# Patient Record
Sex: Female | Born: 1960 | Race: Black or African American | Hispanic: No | Marital: Single | Smoking: Never smoker
Health system: Southern US, Community
[De-identification: ages and names within clinical notes are randomized; demographics above are authoritative.]

## PROBLEM LIST (undated history)

## (undated) DIAGNOSIS — I10 Essential (primary) hypertension: Secondary | ICD-10-CM

## (undated) DIAGNOSIS — C8300 Small cell B-cell lymphoma, unspecified site: Secondary | ICD-10-CM

## (undated) HISTORY — PX: PORTACATH PLACEMENT: SHX2246

## (undated) HISTORY — PX: OTHER SURGICAL HISTORY: SHX169

---

## 2018-04-01 ENCOUNTER — Observation Stay
Admission: EM | Admit: 2018-04-01 | Discharge: 2018-04-02 | Disposition: A | Discharge status: Home / Self Care | Payer: BLUE CROSS/BLUE SHIELD | Attending: Internal Medicine | Admitting: Internal Medicine

## 2018-04-01 ENCOUNTER — Other Ambulatory Visit: Payer: Self-pay

## 2018-04-01 ENCOUNTER — Emergency Department: Payer: BLUE CROSS/BLUE SHIELD

## 2018-04-01 DIAGNOSIS — I10 Essential (primary) hypertension: Secondary | ICD-10-CM | POA: Diagnosis present

## 2018-04-01 DIAGNOSIS — J9859 Other diseases of mediastinum, not elsewhere classified: Secondary | ICD-10-CM | POA: Diagnosis not present

## 2018-04-01 DIAGNOSIS — R609 Edema, unspecified: Secondary | ICD-10-CM

## 2018-04-01 DIAGNOSIS — Z8249 Family history of ischemic heart disease and other diseases of the circulatory system: Secondary | ICD-10-CM | POA: Diagnosis not present

## 2018-04-01 DIAGNOSIS — I82A12 Acute embolism and thrombosis of left axillary vein: Secondary | ICD-10-CM | POA: Diagnosis not present

## 2018-04-01 DIAGNOSIS — R222 Localized swelling, mass and lump, trunk: Secondary | ICD-10-CM | POA: Diagnosis present

## 2018-04-01 DIAGNOSIS — I82622 Acute embolism and thrombosis of deep veins of left upper extremity: Secondary | ICD-10-CM | POA: Diagnosis present

## 2018-04-01 HISTORY — DX: Essential (primary) hypertension: I10

## 2018-04-01 LAB — PROTIME-INR
INR: 0.99
Prothrombin Time: 13 seconds (ref 11.4–15.2)

## 2018-04-01 LAB — CBC
HCT: 34.2 % — ABNORMAL LOW (ref 36.0–46.0)
HEMOGLOBIN: 11.1 g/dL — AB (ref 12.0–15.0)
MCH: 28.7 pg (ref 26.0–34.0)
MCHC: 32.5 g/dL (ref 30.0–36.0)
MCV: 88.4 fL (ref 80.0–100.0)
Platelets: 421 10*3/uL — ABNORMAL HIGH (ref 150–400)
RBC: 3.87 MIL/uL (ref 3.87–5.11)
RDW: 13.1 % (ref 11.5–15.5)
WBC: 12.2 10*3/uL — ABNORMAL HIGH (ref 4.0–10.5)
nRBC: 0 % (ref 0.0–0.2)

## 2018-04-01 LAB — BASIC METABOLIC PANEL
Anion gap: 8 (ref 5–15)
BUN: 14 mg/dL (ref 6–20)
CHLORIDE: 102 mmol/L (ref 98–111)
CO2: 28 mmol/L (ref 22–32)
Calcium: 8.6 mg/dL — ABNORMAL LOW (ref 8.9–10.3)
Creatinine, Ser: 0.73 mg/dL (ref 0.44–1.00)
GFR calc Af Amer: >60 mL/min (ref >60)
GFR calc non Af Amer: >60 mL/min (ref >60)
Glucose, Bld: 109 mg/dL — ABNORMAL HIGH (ref 70–99)
Potassium: 2.9 mmol/L — ABNORMAL LOW (ref 3.5–5.1)
Sodium: 138 mmol/L (ref 135–145)

## 2018-04-01 LAB — FIBRIN DERIVATIVES D-DIMER (ARMC ONLY): Fibrin derivatives D-dimer (ARMC): 3685.09 ng/mL (FEU) — ABNORMAL HIGH (ref 0.00–499.00)

## 2018-04-01 LAB — APTT: aPTT: 30 seconds (ref 24–36)

## 2018-04-01 MED ORDER — ACETAMINOPHEN 325 MG PO TABS: 650.0000 mg | ORAL_TABLET | Freq: Four times a day (QID) | ORAL | Status: DC | PRN

## 2018-04-01 MED ORDER — HEPARIN BOLUS VIA INFUSION
4600.0000 [IU] | Freq: Once | INTRAVENOUS | Status: AC
  Administered 2018-04-01: 4600 [IU] via INTRAVENOUS
  Filled 2018-04-01: qty 4600

## 2018-04-01 MED ORDER — IOHEXOL 350 MG/ML SOLN
75.0000 mL | Freq: Once | INTRAVENOUS | Status: AC | PRN
  Administered 2018-04-01: 75 mL via INTRAVENOUS

## 2018-04-01 MED ORDER — ONDANSETRON HCL 4 MG PO TABS: 4.0000 mg | ORAL_TABLET | Freq: Four times a day (QID) | ORAL | Status: DC | PRN

## 2018-04-01 MED ORDER — ONDANSETRON HCL 4 MG/2ML IJ SOLN: 4.0000 mg | Freq: Four times a day (QID) | INTRAMUSCULAR | Status: DC | PRN

## 2018-04-01 MED ORDER — HEPARIN (PORCINE) 25000 UT/250ML-% IV SOLN
1250.0000 [IU]/h | INTRAVENOUS | Status: DC
  Administered 2018-04-01: 1250 [IU]/h via INTRAVENOUS
  Filled 2018-04-01: qty 250

## 2018-04-01 MED ORDER — ACETAMINOPHEN 650 MG RE SUPP: 650.0000 mg | Freq: Four times a day (QID) | RECTAL | Status: DC | PRN

## 2018-04-01 NOTE — ED Notes (Signed)
100% O2 RA monitor attached to finger on L hand. L radial pulse 1+; R radial 2+. L arm warm. Swollen in upper arm. Cap refill >3 sec on L fingers; R fingers <3 sec.

## 2018-04-01 NOTE — ED Notes (Signed)
Floor, Matilynn, called for pt coming att

## 2018-04-01 NOTE — ED Provider Notes (Signed)
[facility] Emergency Department Provider Note  Time seen: 7:10 PM  I have reviewed the triage vital signs and the nursing notes.   HISTORY  Chief Complaint Arm Swelling    HPI Berneice Zettlemoyer is a 58 y.o. female with a past medical history of hypertension, lung biopsy performed 3 days ago for mass in the chest, presents to the emergency department for left arm swelling beginning last night.  According to the patient since last night she has noted progressive increased swelling and discomfort to left upper extremity.  Denies any fever.  Has noted some mild shortness of breath today especially with exertion.  Denies any chest pain.  No prior history of DVT.  Patient was sent for an ultrasound by her primary care doctor, ultrasound was positive for DVT in the patient was sent to the emergency department.   Past Medical History:  Diagnosis Date  . Hypertension     There are no active problems to display for this patient.   Past Surgical History:  Procedure Laterality Date  . biospy      Prior to Admission medications   Not on File    Not on File  No family history on file.  Social History Social History   Tobacco Use  . Smoking status: Not on file  Substance Use Topics  . Alcohol use: Not on file  . Drug use: Not on file    Review of Systems Constitutional: Negative for fever. Cardiovascular: Negative for chest pain. Respiratory: Positive shortness of breath, worse with exertion. Gastrointestinal: Negative for abdominal pain Genitourinary: Negative for urinary compaints Musculoskeletal: Negative for musculoskeletal complaints Skin: Negative for skin complaints  Neurological: Negative for headache All other ROS negative  ____________________________________________   PHYSICAL EXAM:  VITAL SIGNS: ED Triage Vitals  Enc Vitals Group     BP 04/01/18 1721 110/70     Pulse Rate 04/01/18 1721 (!) 106     Resp 04/01/18 1721 18     Temp  04/01/18 1721 98.3 F (36.8 C)     Temp src --      SpO2 04/01/18 1721 99 %     Weight 04/01/18 1715 199 lb (90.3 kg)     Height 04/01/18 1715 5\' 5"  (1.651 m)     Head Circumference --      Peak Flow --      Pain Score 04/01/18 1715 3     Pain Loc --      Pain Edu? --      Excl. in [place]? --    Constitutional: Alert and oriented. Well appearing and in no distress. Eyes: Normal exam ENT   Head: Normocephalic and atraumatic   Mouth/Throat: Mucous membranes are moist. Cardiovascular: Normal rate, regular rhythm. No murmurs, rubs, or gallops. Respiratory: Normal respiratory effort without tachypnea nor retractions. Breath sounds are clear Gastrointestinal: Soft and nontender. No distention. Musculoskeletal: Patient has moderate left upper extremity edema and tenderness.  Intact radial pulse. Neurologic:  Normal speech and language. No gross focal neurologic deficits  Skin:  Skin is warm, dry and intact.  Psychiatric: Mood and affect are normal.    ____________________________________________     RADIOLOGY  Ultrasound consistent with significant DVT  ____________________________________________   INITIAL IMPRESSION / ASSESSMENT AND PLAN / ED COURSE  Pertinent labs & imaging results that were available during my care of the patient were reviewed by me and considered in my medical decision making (see chart for details).  Patient presents to the  emergency department for left upper extremity edema sent by PCP after a positive ultrasound.  Ultrasound positive for DVT which appears to be fairly extensive.  Patient states mild shortness of breath especially exertion mostly noted today.  Given these findings we will check labs, obtain CT angiography of the chest to help rule out pulmonary embolism and evaluate extent of chest involvement.  Given the extent of DVT we will discuss with vascular surgery once CT angiography is known.  Patient may possibly require admission to the  hospital for further work-up and possible intervention.  CT scan is negative for pulmonary embolism.  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  Small amount of air likely secondary to recent biopsy. ____________________________________________   FINAL CLINICAL IMPRESSION(S) / ED DIAGNOSES  Left upper extremity DVT   Harvest Dark, MD 04/01/18 2047

## 2018-04-01 NOTE — Progress Notes (Signed)
ANTICOAGULATION CONSULT NOTE - Initial Consult  Pharmacy Consult for Heparin Indication: DVT  Not on File  Patient Measurements: Height: 5\' 5"  (165.1 cm) Weight: 199 lb (90.3 kg) IBW/kg (Calculated) : 57 Heparin Dosing Weight: 77 kg  Vital Signs: Temp: 98.3 F (36.8 C) (01/24 1721) BP: 132/87 (01/24 2021) Pulse Rate: 102 (01/24 2021)  Labs: Recent Labs    04/01/18 1906  HGB 11.1*  HCT 34.2*  PLT 421*  CREATININE 0.73    Estimated Creatinine Clearance: 88.2 mL/min (by C-G formula based on SCr of 0.73 mg/dL).   Medical History: Past Medical History:  Diagnosis Date  . Hypertension    Assessment: Patient is a 58yo female presenting with left upper extremity swelling and pain. Ultrasound is positive for DVT. Pharmacy consulted for Heparin dosing.   Goal of Therapy:  Heparin level 0.3-0.7 units/ml Monitor platelets by anticoagulation protocol: Yes   Plan:  Give 4600 units bolus x 1 Start heparin infusion at 1250 units/hr Check anti-Xa level in 6 hours and daily while on heparin Continue to monitor H&H and platelets  Paulina Fusi, PharmD, BCPS 04/01/2018 9:00 PM

## 2018-04-01 NOTE — ED Notes (Signed)
Pt given warm blankets. Family at bedside.

## 2018-04-01 NOTE — ED Notes (Signed)
Pt leaving for CT.  

## 2018-04-01 NOTE — ED Triage Notes (Signed)
Pt comes via POV from home with c/o left arm swelling. Pt states she noticed it yesterday and thought it would go down but it didn't.    Pt called PCP and was told to come here. Pt states she had biopsy surgery completed on Tuesday.   Pt able to move arm but unable to raise higher than shoulder.

## 2018-04-01 NOTE — ED Notes (Addendum)
L wrist radial pulse 1+; warm, cap refill >3 sec. Pt denies pain/numbness to L arm. Pt denies SOB currently; sat 98% RA; RR 23; continues to cough some.

## 2018-04-01 NOTE — ED Notes (Signed)
Full rainbow sent 

## 2018-04-01 NOTE — H&P (Signed)
[place] [facility] [place] NAME: Brianna Chung    MR#:  [id number]  DATE OF BIRTH:  02-Mar-1961  DATE OF ADMISSION:  04/01/2018  PRIMARY CARE PHYSICIAN: [facility]   REQUESTING/REFERRING PHYSICIAN: Kerman Passey, MD  CHIEF COMPLAINT:   Chief Complaint  Patient presents with  . Arm Swelling    HISTORY OF PRESENT ILLNESS:  Brianna Chung  is a 58 y.o. female who presents with chief complaint as above.  Patient presents with 2 days of increasing left upper extremity swelling.  She was recently diagnosed with a mediastinal mass and is undergoing treatment for that including recent biopsy for which results are not back yet.  However, yesterday she noticed swelling to her left upper extremity, and noticed that it had gotten worse by this morning.  She called her doctor who recommended she come in for evaluation for possible DVT.  Here in the ED she was worked up and was found to have a significant left upper extremity DVT.  Vascular surgery was contacted by ED physician and they stated that in the setting of cancer directed thrombolytics would not be indicated.  Hospitalist were called for admission and treatment  PAST MEDICAL HISTORY:   Past Medical History:  Diagnosis Date  . Hypertension      PAST SURGICAL HISTORY:   Past Surgical History:  Procedure Laterality Date  . biospy       SOCIAL HISTORY:   Social History   Tobacco Use  . Smoking status: Never Smoker  Substance Use Topics  . Alcohol use: Yes    Alcohol/week: 1.0 standard drinks    Types: 1 Standard drinks or equivalent per week    Comment: margarita     FAMILY HISTORY:   Family History  Problem Relation Age of Onset  . Hypertension Mother   . Hypertension Father      DRUG ALLERGIES:  Not on File  MEDICATIONS AT HOME:   Prior to Admission medications   Not on File    REVIEW OF SYSTEMS:  Review of Systems  Constitutional: Negative  for chills, fever, malaise/fatigue and weight loss.  HENT: Negative for ear pain, hearing loss and tinnitus.   Eyes: Negative for blurred vision, double vision, pain and redness.  Respiratory: Negative for cough, hemoptysis and shortness of breath.   Cardiovascular: Negative for chest pain, palpitations, orthopnea and leg swelling.  Gastrointestinal: Negative for abdominal pain, constipation, diarrhea, nausea and vomiting.  Genitourinary: Negative for dysuria, frequency and hematuria.  Musculoskeletal: Negative for back pain, joint pain and neck pain.       Left upper extremity swelling  Skin:       No acne, rash, or lesions  Neurological: Negative for dizziness, tremors, focal weakness and weakness.  Endo/Heme/Allergies: Negative for polydipsia. Does not bruise/bleed easily.  Psychiatric/Behavioral: Negative for depression. The patient is not nervous/anxious and does not have insomnia.      VITAL SIGNS:   Vitals:   04/01/18 1915 04/01/18 1950 04/01/18 2021 04/01/18 2021  BP:    132/87  Pulse: 99 100 (!) 102   Resp: (!) 26 (!) 27 20   Temp:      SpO2: 99% 99% 97%   Weight:      Height:       Wt Readings from Last 3 Encounters:  04/01/18 90.3 kg    PHYSICAL EXAMINATION:  Physical Exam  Vitals reviewed. Constitutional: She is oriented to person, place, and time. She appears  well-developed and well-nourished. No distress.  HENT:  Head: Normocephalic and atraumatic.  Mouth/Throat: Oropharynx is clear and moist.  Eyes: Pupils are equal, round, and reactive to light. Conjunctivae and EOM are normal. No scleral icterus.  Neck: Normal range of motion. Neck supple. No JVD present. No thyromegaly present.  Cardiovascular: Normal rate, regular rhythm and intact distal pulses. Exam reveals no gallop and no friction rub.  No murmur heard. Respiratory: Effort normal and breath sounds normal. No respiratory distress. She has no wheezes. She has no rales.  GI: Soft. Bowel sounds are  normal. She exhibits no distension. There is no abdominal tenderness.  Musculoskeletal: Normal range of motion.        General: Edema (Left upper extremity) present.     Comments: No arthritis, no gout  Lymphadenopathy:    She has no cervical adenopathy.  Neurological: She is alert and oriented to person, place, and time. No cranial nerve deficit.  No dysarthria, no aphasia  Skin: Skin is warm and dry. No rash noted. No erythema.  Psychiatric: She has a normal mood and affect. Her behavior is normal. Judgment and thought content normal.    LABORATORY PANEL:   CBC Recent Labs  Lab 04/01/18 1906  WBC 12.2*  HGB 11.1*  HCT 34.2*  PLT 421*   ------------------------------------------------------------------------------------------------------------------  Chemistries  Recent Labs  Lab 04/01/18 1906  NA 138  K 2.9*  CL 102  CO2 28  GLUCOSE 109*  BUN 14  CREATININE 0.73  CALCIUM 8.6*   ------------------------------------------------------------------------------------------------------------------  Cardiac Enzymes No results for input(s): TROPONINI in the last 168 hours. ------------------------------------------------------------------------------------------------------------------  RADIOLOGY:  Ct Angio Chest Pe W And/or Wo Contrast  Result Date: 04/01/2018 CLINICAL DATA:  PE suspected, high pretest prob SOB, large LUE clot. Left arm swelling and shortness of breath. EXAM: CT ANGIOGRAPHY CHEST WITH CONTRAST TECHNIQUE: Multidetector CT imaging of the chest was performed using the standard protocol during bolus administration of intravenous contrast. Multiplanar CT image reconstructions and MIPs were obtained to evaluate the vascular anatomy. CONTRAST:  64mL OMNIPAQUE IOHEXOL 350 MG/ML SOLN COMPARISON:  No prior chest imaging. FINDINGS: Cardiovascular: No filling defects in the pulmonary arteries to suggest pulmonary embolus. The left upper lobe pulmonary arteries attenuated  due to large anterior mediastinal and hilar masslike opacity which partially encases segmental branches to the lingula and upper lobe. Anterior mediastinal mass abuts the aortic arch and SVC. No evidence of SVC occlusion, with dense contrast visualized throughout the entirety of the SVC. The left subclavian vein is not well evaluated on the current exam, but was occluded on ultrasound. No aortic dissection or aneurysm. The heart is normal in size. There is pericardial thickening/effusion inferiorly, which is in part contiguous with mediastinal mass. Mediastinum/Nodes: Large lobulated anterior mediastinal masslike opacity is contiguous with the left hilum. Dimensions are difficult to accurately define due to the shape size of the lesion, however measures at least 13 x 7.2 x 6.6 cm. Internal density is slightly heterogeneous. No internal calcifications. Mass lesion causes mass effect on left upper lobe bronchus, which is difficult to define but likely occluded, for example image 35 series 7. Mild left supraclavicular adenopathy. Prominent subcarinal/inferior right hilar lymph node measuring 13 mm short axis. Irregularly-shaped hypodense left thyroid nodule measures at least 2.7 cm cranial caudal dimension. There is mild mass effect on the trachea to the right. Small left axillary nodes. Lungs/Pleura: Masslike opacity in the anterior mediastinum is contiguous with the left hilum, unclear if this is central lung lesion  with associated conglomerate adenopathy or simply massive adenopathy. Borders in the left upper lobe are nodular and irregular with small left upper lobe nodules, for example image 51 series 7. Moderate size left pleural effusion which is partially loculated, causes partial atelectasis of the left lower lobe. No other than mild atelectasis in the right lower lobe, the right lung is clear. No confluent airspace disease or pulmonary nodule. No evidence of pneumothorax. Upper Abdomen: No nodule the  visualized adrenal glands. No definite focal hepatic lesion, use of contrast slightly limits assessment. Musculoskeletal: Air in the left anterior chest wall with associated thickening of the left pectoralis muscles, patient with recent chest mass biopsy. Subareolar breast tissue is fairly symmetric. No frank bony destructive change or evidence of focal lesion, with particular attention to left anterior ribs. Review of the MIP images confirms the above findings. IMPRESSION: 1. No pulmonary embolus. Left upper lobe pulmonary arteries are attenuated due to mediastinal mass. 2. 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 This has been reportedly biopsied an outside institution. Small amount of air and stranding in the left anterior chest wall is likely secondary to recent biopsy, recommend correlation with procedural history. 3. Moderate partially loculated left pleural effusion. Associated compressive atelectasis in the left lower lobe. 4. Left thyroid nodule. Electronically Signed   By: Keith Rake M.D.   On: 04/01/2018 20:39   US Venous Img Upper Uni Left  Result Date: 04/01/2018 CLINICAL DATA:  Left arm swelling for 1 day EXAM: LEFT UPPER EXTREMITY VENOUS DOPPLER ULTRASOUND TECHNIQUE: Gray-scale sonography with graded compression, as well as color Doppler and duplex ultrasound were performed to evaluate the upper extremity deep venous system from the level of the subclavian vein and including the jugular, axillary, basilic, radial, ulnar and upper cephalic vein. Spectral Doppler was utilized to evaluate flow at rest and with distal augmentation maneuvers. COMPARISON:  None. FINDINGS: Contralateral Subclavian Vein: Respiratory phasicity is normal and symmetric with the symptomatic side. No evidence of thrombus. Normal compressibility. Internal Jugular Vein: Occlusive thrombus is noted. Subclavian Vein: Extensive thrombus is noted.  Minimal flow is seen.  Axillary Vein: Occlusive thrombus is noted. Cephalic Vein: No evidence of thrombus. Normal compressibility, respiratory phasicity and response to augmentation. Basilic Vein: Occlusive thrombus is noted. Brachial Veins: 1 of the paired brachial veins demonstrates occlusive thrombus. Radial Veins: No evidence of thrombus. Normal compressibility, respiratory phasicity and response to augmentation. Ulnar Veins: No evidence of thrombus. Normal compressibility, respiratory phasicity and response to augmentation. Venous Reflux:  None visualized. Other Findings:  None visualized. IMPRESSION: Extensive left upper extremity thrombus involving the jugular, subclavian, axillary, basilic and brachial veins. Electronically Signed   By: Inez Catalina M.D.   On: 04/01/2018 18:35    EKG:  No orders found for this or any previous visit.  IMPRESSION AND PLAN:  Principal Problem:   Acute deep vein thrombosis (DVT) of left upper extremity (HCC) -IV heparin started.  Patient does not have any known provoking factors other than the possibility of this mass being cancer.  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 Labs, imaging and/or ECG reviewed by provider and discussed with patient/family. Management plans discussed with the patient and/or family.  DVT PROPHYLAXIS: Systemic anticoagulation  GI PROPHYLAXIS:  None  ADMISSION STATUS: Inpatient     CODE STATUS: Full  TOTAL TIME TAKING CARE OF THIS PATIENT: 45 minutes.   Ethlyn Daniels 04/01/2018, 9:34 PM  [facility]  Office  [phone]  CC: Primary care physician; [facility]  Note:  This document was prepared using Dragon voice recognition software and may include  unintentional dictation errors.

## 2018-04-01 NOTE — ED Notes (Signed)
MD Paduchowski and RN Metta Clines aware of pt being room and status.

## 2018-04-01 NOTE — ED Notes (Signed)
Pt denies CP but states recent SOB. Currently resting in bed with reg/unlabored breathing.

## 2018-04-02 LAB — BASIC METABOLIC PANEL
Anion gap: 7 (ref 5–15)
BUN: 11 mg/dL (ref 6–20)
CO2: 26 mmol/L (ref 22–32)
CREATININE: 0.78 mg/dL (ref 0.44–1.00)
Calcium: 8.7 mg/dL — ABNORMAL LOW (ref 8.9–10.3)
Chloride: 105 mmol/L (ref 98–111)
GFR calc Af Amer: >60 mL/min (ref >60)
GFR calc non Af Amer: >60 mL/min (ref >60)
Glucose, Bld: 114 mg/dL — ABNORMAL HIGH (ref 70–99)
Potassium: 3.1 mmol/L — ABNORMAL LOW (ref 3.5–5.1)
SODIUM: 138 mmol/L (ref 135–145)

## 2018-04-02 LAB — CBC
HCT: 32.5 % — ABNORMAL LOW (ref 36.0–46.0)
Hemoglobin: 10.6 g/dL — ABNORMAL LOW (ref 12.0–15.0)
MCH: 28.2 pg (ref 26.0–34.0)
MCHC: 32.6 g/dL (ref 30.0–36.0)
MCV: 86.4 fL (ref 80.0–100.0)
Platelets: 417 10*3/uL — ABNORMAL HIGH (ref 150–400)
RBC: 3.76 MIL/uL — ABNORMAL LOW (ref 3.87–5.11)
RDW: 13.2 % (ref 11.5–15.5)
WBC: 11.7 10*3/uL — ABNORMAL HIGH (ref 4.0–10.5)
nRBC: 0 % (ref 0.0–0.2)

## 2018-04-02 LAB — HEPARIN LEVEL (UNFRACTIONATED)
Heparin Unfractionated: 0.67 IU/mL (ref 0.30–0.70)
Heparin Unfractionated: 0.6 IU/mL (ref 0.30–0.70)

## 2018-04-02 MED ORDER — APIXABAN 5 MG PO TABS: 5.0000 mg | ORAL_TABLET | Freq: Two times a day (BID) | ORAL | 0 refills | Status: AC

## 2018-04-02 MED ORDER — APIXABAN 5 MG PO TABS
10.0000 mg | ORAL_TABLET | Freq: Two times a day (BID) | ORAL | Status: DC
  Administered 2018-04-02: 10 mg via ORAL
  Filled 2018-04-02: qty 2

## 2018-04-02 MED ORDER — APIXABAN 5 MG PO TABS: 5.0000 mg | ORAL_TABLET | Freq: Two times a day (BID) | ORAL | Status: DC

## 2018-04-02 MED ORDER — APIXABAN 5 MG PO TABS: 10.0000 mg | ORAL_TABLET | Freq: Two times a day (BID) | ORAL | 0 refills | Status: DC

## 2018-04-02 NOTE — Discharge Summary (Signed)
[place] at [place] NAME: Brianna Chung    MR#:  [id number]  [place]:  1960-07-24  DATE OF ADMISSION:  04/01/2018 ADMITTING PHYSICIAN: Lance Coon, MD  DATE OF DISCHARGE: April 02, 2018  PRIMARY CARE PHYSICIAN: Center, [facility]    ADMISSION DIAGNOSIS:  Swelling [R60.9] Acute deep vein thrombosis (DVT) of axillary vein of left upper extremity (HCC) [email or web address].[email or web address]]  DISCHARGE DIAGNOSIS:  Principal Problem:   Acute deep vein thrombosis (DVT) of left upper extremity (HCC) Active Problems:   Mediastinal mass   HTN (hypertension)   SECONDARY DIAGNOSIS:   Past Medical History:  Diagnosis Date  . Hypertension     HOSPITAL COURSE:   58 year old female with history of recent diagnosis of mediastinal mass who presented to the emergency room due to left upper extremity arm swelling.  1.  Acute left upper extremity DVT: Patient was transitioned from heparin to Eliquis. Side effects, alternatives, risks and benefits were discussed with patient who accepts the risk not included to only bleeding. She will have follow-up with her PCP.  2.  Mediastinal mass: This is being worked up at [facility]. 3.  Essential hypertension: Continue HCTZ and losartan  DISCHARGE CONDITIONS AND DIET:   Stable for discharge on heart healthy diet  CONSULTS OBTAINED:    DRUG ALLERGIES:  No Known Allergies  DISCHARGE MEDICATIONS:   Allergies as of 04/02/2018   No Known Allergies     Medication List    STOP taking these medications   oxyCODONE 5 MG immediate release tablet Commonly known as:  Oxy IR/ROXICODONE     TAKE these medications   apixaban 5 MG Tabs tablet Commonly known as:  ELIQUIS Take 2 tablets (10 mg total) by mouth 2 (two) times daily for 6 days.   apixaban 5 MG Tabs tablet Commonly known as:  ELIQUIS Take 1 tablet (5 mg total) by mouth 2 (two) times daily. Start taking on:  April 09, 2018   hydrochlorothiazide  12.5 MG tablet Commonly known as:  HYDRODIURIL Take 12.5 mg by mouth daily.   losartan 100 MG tablet Commonly known as:  COZAAR Take 100 mg by mouth daily.   potassium chloride SA 20 MEQ tablet Commonly known as:  K-DUR,KLOR-CON Take 20 mEq by mouth 2 (two) times daily.   Vitamin D (Ergocalciferol) 1.25 MG (50000 UT) Caps capsule Commonly known as:  DRISDOL Take 1 capsule by mouth every Sunday.         Today   CHIEF COMPLAINT:  Patient is doing well and ready to be discharged today   VITAL SIGNS:  Blood pressure 133/90, pulse 90, temperature 98.7 F (37.1 C), temperature source Oral, resp. rate 20, height 5\' 5"  (1.651 m), weight 90.7 kg, last menstrual period 03/30/2018, SpO2 96 %.   REVIEW OF SYSTEMS:  Review of Systems  Constitutional: Negative.  Negative for chills, fever and malaise/fatigue.  HENT: Negative.  Negative for ear discharge, ear pain, hearing loss, nosebleeds and sore throat.   Eyes: Negative.  Negative for blurred vision and pain.  Respiratory: Negative.  Negative for cough, hemoptysis, shortness of breath and wheezing.   Cardiovascular: Negative.  Negative for chest pain, palpitations and leg swelling.  Gastrointestinal: Negative.  Negative for abdominal pain, blood in stool, diarrhea, nausea and vomiting.  Genitourinary: Negative.  Negative for dysuria.  Musculoskeletal: Negative for back pain.       Upper extremity swelling  Skin: Negative.   Neurological: Negative for dizziness, tremors,  speech change, focal weakness, seizures and headaches.  Endo/Heme/Allergies: Negative.  Does not bruise/bleed easily.  Psychiatric/Behavioral: Negative.  Negative for depression, hallucinations and suicidal ideas.     PHYSICAL EXAMINATION:  GENERAL:  58 y.o.-year-old patient lying in the bed with no acute distress.  NECK:  Supple, no jugular venous distention. No thyroid enlargement, no tenderness.  LUNGS: Normal breath sounds bilaterally, no wheezing,  rales,rhonchi  No use of accessory muscles of respiration.  CARDIOVASCULAR: S1, S2 normal. No murmurs, rubs, or gallops.  ABDOMEN: Soft, non-tender, non-distended. Bowel sounds present. No organomegaly or mass.  EXTREMITIES: No pedal edema, cyanosis, or clubbing.  Left upper extremity swelling PSYCHIATRIC: The patient is alert and oriented x 3.  SKIN: No obvious rash, lesion, or ulcer.   DATA REVIEW:   CBC Recent Labs  Lab 04/02/18 0350  WBC 11.7*  HGB 10.6*  HCT 32.5*  PLT 417*    Chemistries  Recent Labs  Lab 04/02/18 0350  NA 138  K 3.1*  CL 105  CO2 26  GLUCOSE 114*  BUN 11  CREATININE 0.78  CALCIUM 8.7*    Cardiac Enzymes No results for input(s): TROPONINI in the last 168 hours.  Microbiology Results  @MICRORSLT48 @  RADIOLOGY:  Ct Angio Chest Pe W And/or Wo Contrast  Result Date: 04/01/2018 CLINICAL DATA:  PE suspected, high pretest prob SOB, large LUE clot. Left arm swelling and shortness of breath. EXAM: CT ANGIOGRAPHY CHEST WITH CONTRAST TECHNIQUE: Multidetector CT imaging of the chest was performed using the standard protocol during bolus administration of intravenous contrast. Multiplanar CT image reconstructions and MIPs were obtained to evaluate the vascular anatomy. CONTRAST:  67mL OMNIPAQUE IOHEXOL 350 MG/ML SOLN COMPARISON:  No prior chest imaging. FINDINGS: Cardiovascular: No filling defects in the pulmonary arteries to suggest pulmonary embolus. The left upper lobe pulmonary arteries attenuated due to large anterior mediastinal and hilar masslike opacity which partially encases segmental branches to the lingula and upper lobe. Anterior mediastinal mass abuts the aortic arch and SVC. No evidence of SVC occlusion, with dense contrast visualized throughout the entirety of the SVC. The left subclavian vein is not well evaluated on the current exam, but was occluded on ultrasound. No aortic dissection or aneurysm. The heart is normal in size. There is  pericardial thickening/effusion inferiorly, which is in part contiguous with mediastinal mass. Mediastinum/Nodes: Large lobulated anterior mediastinal masslike opacity is contiguous with the left hilum. Dimensions are difficult to accurately define due to the shape size of the lesion, however measures at least 13 x 7.2 x 6.6 cm. Internal density is slightly heterogeneous. No internal calcifications. Mass lesion causes mass effect on left upper lobe bronchus, which is difficult to define but likely occluded, for example image 35 series 7. Mild left supraclavicular adenopathy. Prominent subcarinal/inferior right hilar lymph node measuring 13 mm short axis. Irregularly-shaped hypodense left thyroid nodule measures at least 2.7 cm cranial caudal dimension. There is mild mass effect on the trachea to the right. Small left axillary nodes. Lungs/Pleura: Masslike opacity in the anterior mediastinum is contiguous with the left hilum, unclear if this is central lung lesion with associated conglomerate adenopathy or simply massive adenopathy. Borders in the left upper lobe are nodular and irregular with small left upper lobe nodules, for example image 51 series 7. Moderate size left pleural effusion which is partially loculated, causes partial atelectasis of the left lower lobe. No other than mild atelectasis in the right lower lobe, the right lung is clear. No confluent airspace disease or  pulmonary nodule. No evidence of pneumothorax. Upper Abdomen: No nodule the visualized adrenal glands. No definite focal hepatic lesion, use of contrast slightly limits assessment. Musculoskeletal: Air in the left anterior chest wall with associated thickening of the left pectoralis muscles, patient with recent chest mass biopsy. Subareolar breast tissue is fairly symmetric. No frank bony destructive change or evidence of focal lesion, with particular attention to left anterior ribs. Review of the MIP images confirms the above findings.  IMPRESSION: 1. No pulmonary embolus. Left upper lobe pulmonary arteries are attenuated due to mediastinal mass. 2. 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 This has been reportedly biopsied an outside institution. Small amount of air and stranding in the left anterior chest wall is likely secondary to recent biopsy, recommend correlation with procedural history. 3. Moderate partially loculated left pleural effusion. Associated compressive atelectasis in the left lower lobe. 4. Left thyroid nodule. Electronically Signed   By: Keith Rake M.D.   On: 04/01/2018 20:39   US Venous Img Upper Uni Left  Result Date: 04/01/2018 CLINICAL DATA:  Left arm swelling for 1 day EXAM: LEFT UPPER EXTREMITY VENOUS DOPPLER ULTRASOUND TECHNIQUE: Gray-scale sonography with graded compression, as well as color Doppler and duplex ultrasound were performed to evaluate the upper extremity deep venous system from the level of the subclavian vein and including the jugular, axillary, basilic, radial, ulnar and upper cephalic vein. Spectral Doppler was utilized to evaluate flow at rest and with distal augmentation maneuvers. COMPARISON:  None. FINDINGS: Contralateral Subclavian Vein: Respiratory phasicity is normal and symmetric with the symptomatic side. No evidence of thrombus. Normal compressibility. Internal Jugular Vein: Occlusive thrombus is noted. Subclavian Vein: Extensive thrombus is noted.  Minimal flow is seen. Axillary Vein: Occlusive thrombus is noted. Cephalic Vein: No evidence of thrombus. Normal compressibility, respiratory phasicity and response to augmentation. Basilic Vein: Occlusive thrombus is noted. Brachial Veins: 1 of the paired brachial veins demonstrates occlusive thrombus. Radial Veins: No evidence of thrombus. Normal compressibility, respiratory phasicity and response to augmentation. Ulnar Veins: No evidence of thrombus. Normal compressibility,  respiratory phasicity and response to augmentation. Venous Reflux:  None visualized. Other Findings:  None visualized. IMPRESSION: Extensive left upper extremity thrombus involving the jugular, subclavian, axillary, basilic and brachial veins. Electronically Signed   By: Inez Catalina M.D.   On: 04/01/2018 18:35      Allergies as of 04/02/2018   No Known Allergies     Medication List    STOP taking these medications   oxyCODONE 5 MG immediate release tablet Commonly known as:  Oxy IR/ROXICODONE     TAKE these medications   apixaban 5 MG Tabs tablet Commonly known as:  ELIQUIS Take 2 tablets (10 mg total) by mouth 2 (two) times daily for 6 days.   apixaban 5 MG Tabs tablet Commonly known as:  ELIQUIS Take 1 tablet (5 mg total) by mouth 2 (two) times daily. Start taking on:  April 09, 2018   hydrochlorothiazide 12.5 MG tablet Commonly known as:  HYDRODIURIL Take 12.5 mg by mouth daily.   losartan 100 MG tablet Commonly known as:  COZAAR Take 100 mg by mouth daily.   potassium chloride SA 20 MEQ tablet Commonly known as:  K-DUR,KLOR-CON Take 20 mEq by mouth 2 (two) times daily.   Vitamin D (Ergocalciferol) 1.25 MG (50000 UT) Caps capsule Commonly known as:  DRISDOL Take 1 capsule by mouth every Sunday.  Management plans discussed with the patient and she is in agreement. Stable for discharge home  Patient should follow up with pcp  CODE STATUS:     Code Status Orders  (From admission, onward)         Start     Ordered   04/01/18 2237  Full code  Continuous     04/01/18 2236        Code Status History    This patient has a current code status but no historical code status.      TOTAL TIME TAKING CARE OF THIS PATIENT: 38 minutes.    Note: This dictation was prepared with Dragon dictation along with smaller phrase technology. Any transcriptional errors that result from this process are unintentional.  Vern Guerette M.D on 04/02/2018 at  10:50 AM  Between 7am to 6pm - Pager - [phone] After 6pm go to [email or web address] - password [email or web address] [facility]  Office  [phone]  CC: Primary care physician; Center, [facility]

## 2018-04-02 NOTE — Progress Notes (Signed)
ANTICOAGULATION CONSULT NOTE - Initial Consult  Pharmacy Consult for apixaban (Eliquis) Dosing  Indication: DVT  No Known Allergies  Patient Measurements: Height: 5\' 5"  (165.1 cm) Weight: 200 lb (90.7 kg) IBW/kg (Calculated) : 57 Heparin Dosing Weight: 77 kg  Vital Signs: Temp: 98.7 F (37.1 C) (01/25 0549) Temp Source: Oral (01/25 0549) BP: 133/90 (01/25 0549) Pulse Rate: 90 (01/25 0549)  Labs: Recent Labs    04/01/18 1720 04/01/18 1906 04/02/18 0350  HGB  --  11.1* 10.6*  HCT  --  34.2* 32.5*  PLT  --  421* 417*  APTT 30  --   --   LABPROT 13.0  --   --   INR 0.99  --   --   HEPARINUNFRC  --   --  0.67  CREATININE  --  0.73 0.78    Estimated Creatinine Clearance: 88.4 mL/min (by C-G formula based on SCr of 0.78 mg/dL).   Medical History: Past Medical History:  Diagnosis Date  . Hypertension    Assessment: Patient is a 57yo female presenting with left upper extremity swelling and pain. Ultrasound is positive for DVT. Pharmacy consulted for Heparin dosing on admission. Pharmacy now consulted for apixaban dosing.    Plan:  Start Apixaban 10mg  BID x 7 days, followed by apixaban 5mg  BID thereafter.   Heparin infusion to stop at same time 1st dose of apixaban is given.   Pernell Dupre, PharmD, BCPS Clinical Pharmacist 04/02/2018 10:29 AM

## 2018-04-02 NOTE — Discharge Instructions (Signed)

## 2018-04-02 NOTE — Progress Notes (Signed)
ANTICOAGULATION CONSULT NOTE - Initial Consult  Pharmacy Consult for Heparin Indication: DVT  No Known Allergies  Patient Measurements: Height: 5\' 5"  (165.1 cm) Weight: 200 lb (90.7 kg) IBW/kg (Calculated) : 57 Heparin Dosing Weight: 77 kg  Vital Signs: Temp: 99.1 F (37.3 C) (01/24 2251) Temp Source: Oral (01/24 2251) BP: 142/85 (01/24 2251) Pulse Rate: 107 (01/24 2251)  Labs: Recent Labs    04/01/18 1720 04/01/18 1906 04/02/18 0350  HGB  --  11.1* 10.6*  HCT  --  34.2* 32.5*  PLT  --  421* 417*  APTT 30  --   --   LABPROT 13.0  --   --   INR 0.99  --   --   HEPARINUNFRC  --   --  0.67  CREATININE  --  0.73 0.78    Estimated Creatinine Clearance: 88.4 mL/min (by C-G formula based on SCr of 0.78 mg/dL).   Medical History: Past Medical History:  Diagnosis Date  . Hypertension    Assessment: Patient is a 58yo female presenting with left upper extremity swelling and pain. Ultrasound is positive for DVT. Pharmacy consulted for Heparin dosing.   Goal of Therapy:  Heparin level 0.3-0.7 units/ml Monitor platelets by anticoagulation protocol: Yes   Plan:  Give 4600 units bolus x 1 Start heparin infusion at 1250 units/hr Check anti-Xa level in 6 hours and daily while on heparin Continue to monitor H&H and platelets   1/25 AM heparin level 0.67. Recheck in 6 hours to confirm.  Sim Boast, PharmD, BCPS  04/02/18 4:47 AM

## 2018-04-02 NOTE — Progress Notes (Signed)
MD ordered discharge home. Discharge instructions, prescriptions and instructions for follow up appointment was reviewed with Brianna Chung. Printed information on DVT and Eliquis were reviewed and provided. Patient verbalized understanding.

## 2018-04-04 LAB — HIV ANTIBODY (ROUTINE TESTING W REFLEX): HIV SCREEN 4TH GENERATION: NONREACTIVE

## 2018-04-08 ENCOUNTER — Telehealth: Payer: Self-pay | Admitting: Licensed Clinical Social Worker

## 2018-04-08 NOTE — Telephone Encounter (Signed)
CSW attempted to follow up with patient regarding EMMI response. Patient's contact number went straight to voicemail. CSW will attempt a second time. Shela Leff MSW,LCSW [phone]

## 2018-04-10 ENCOUNTER — Telehealth: Payer: Self-pay | Admitting: Licensed Clinical Social Worker

## 2018-04-10 NOTE — Telephone Encounter (Signed)
The CSW attempted a second contact with the patient to discuss her answers on EMMI call indicating anhedonia and risk for depression. The CSW was unable to reach the patient and left a HIPPA compliant voicemail with contact information for today and regular business hours.  Santiago Bumpers, MSW, Latanya Presser [phone]

## 2018-04-16 ENCOUNTER — Inpatient Hospital Stay
Admission: EM | Admit: 2018-04-16 | Discharge: 2018-04-20 | DRG: 871 | Disposition: A | Discharge status: Home / Self Care | Payer: BLUE CROSS/BLUE SHIELD | Attending: Internal Medicine | Admitting: Internal Medicine

## 2018-04-16 ENCOUNTER — Other Ambulatory Visit: Payer: Self-pay

## 2018-04-16 ENCOUNTER — Emergency Department: Payer: BLUE CROSS/BLUE SHIELD

## 2018-04-16 ENCOUNTER — Encounter: Payer: Self-pay | Admitting: Emergency Medicine

## 2018-04-16 DIAGNOSIS — Z79899 Other long term (current) drug therapy: Secondary | ICD-10-CM

## 2018-04-16 DIAGNOSIS — C8330 Diffuse large B-cell lymphoma, unspecified site: Secondary | ICD-10-CM | POA: Diagnosis present

## 2018-04-16 DIAGNOSIS — Z7901 Long term (current) use of anticoagulants: Secondary | ICD-10-CM

## 2018-04-16 DIAGNOSIS — D701 Agranulocytosis secondary to cancer chemotherapy: Secondary | ICD-10-CM | POA: Diagnosis present

## 2018-04-16 DIAGNOSIS — I10 Essential (primary) hypertension: Secondary | ICD-10-CM | POA: Diagnosis present

## 2018-04-16 DIAGNOSIS — J189 Pneumonia, unspecified organism: Secondary | ICD-10-CM | POA: Diagnosis present

## 2018-04-16 DIAGNOSIS — T451X5A Adverse effect of antineoplastic and immunosuppressive drugs, initial encounter: Secondary | ICD-10-CM | POA: Diagnosis present

## 2018-04-16 DIAGNOSIS — A419 Sepsis, unspecified organism: Secondary | ICD-10-CM | POA: Diagnosis present

## 2018-04-16 DIAGNOSIS — Z8249 Family history of ischemic heart disease and other diseases of the circulatory system: Secondary | ICD-10-CM | POA: Diagnosis not present

## 2018-04-16 DIAGNOSIS — I82C12 Acute embolism and thrombosis of left internal jugular vein: Secondary | ICD-10-CM | POA: Diagnosis present

## 2018-04-16 DIAGNOSIS — Z95828 Presence of other vascular implants and grafts: Secondary | ICD-10-CM | POA: Diagnosis not present

## 2018-04-16 DIAGNOSIS — I82622 Acute embolism and thrombosis of deep veins of left upper extremity: Secondary | ICD-10-CM | POA: Diagnosis present

## 2018-04-16 DIAGNOSIS — Z86718 Personal history of other venous thrombosis and embolism: Secondary | ICD-10-CM | POA: Diagnosis not present

## 2018-04-16 DIAGNOSIS — C8520 Mediastinal (thymic) large B-cell lymphoma, unspecified site: Secondary | ICD-10-CM | POA: Diagnosis not present

## 2018-04-16 DIAGNOSIS — J90 Pleural effusion, not elsewhere classified: Secondary | ICD-10-CM | POA: Diagnosis present

## 2018-04-16 DIAGNOSIS — Z801 Family history of malignant neoplasm of trachea, bronchus and lung: Secondary | ICD-10-CM | POA: Diagnosis not present

## 2018-04-16 DIAGNOSIS — E876 Hypokalemia: Secondary | ICD-10-CM | POA: Diagnosis present

## 2018-04-16 DIAGNOSIS — D6959 Other secondary thrombocytopenia: Secondary | ICD-10-CM | POA: Diagnosis present

## 2018-04-16 DIAGNOSIS — R0602 Shortness of breath: Secondary | ICD-10-CM

## 2018-04-16 DIAGNOSIS — R509 Fever, unspecified: Secondary | ICD-10-CM

## 2018-04-16 DIAGNOSIS — R5383 Other fatigue: Secondary | ICD-10-CM | POA: Diagnosis not present

## 2018-04-16 HISTORY — DX: Small cell B-cell lymphoma, unspecified site: C83.00

## 2018-04-16 LAB — CBC WITH DIFFERENTIAL/PLATELET
ABS IMMATURE GRANULOCYTES: 0.06 10*3/uL (ref 0.00–0.07)
Basophils Absolute: 0 10*3/uL (ref 0.0–0.1)
Basophils Relative: 0 %
Eosinophils Absolute: 0 10*3/uL (ref 0.0–0.5)
Eosinophils Relative: 0 %
HCT: 30.2 % — ABNORMAL LOW (ref 36.0–46.0)
HEMOGLOBIN: 10.4 g/dL — AB (ref 12.0–15.0)
Immature Granulocytes: 1 %
LYMPHS ABS: 0.2 10*3/uL — AB (ref 0.7–4.0)
LYMPHS PCT: 3 %
MCH: 28.5 pg (ref 26.0–34.0)
MCHC: 34.4 g/dL (ref 30.0–36.0)
MCV: 82.7 fL (ref 80.0–100.0)
Monocytes Absolute: 0 10*3/uL — ABNORMAL LOW (ref 0.1–1.0)
Monocytes Relative: 0 %
Neutro Abs: 7.2 10*3/uL (ref 1.7–7.7)
Neutrophils Relative %: 96 %
Platelets: 314 10*3/uL (ref 150–400)
RBC: 3.65 MIL/uL — ABNORMAL LOW (ref 3.87–5.11)
RDW: 12.2 % (ref 11.5–15.5)
WBC: 7.5 10*3/uL (ref 4.0–10.5)
nRBC: 0 % (ref 0.0–0.2)

## 2018-04-16 LAB — BASIC METABOLIC PANEL
Anion gap: 7 (ref 5–15)
BUN: 22 mg/dL — ABNORMAL HIGH (ref 6–20)
CALCIUM: 8.6 mg/dL — AB (ref 8.9–10.3)
CO2: 28 mmol/L (ref 22–32)
Chloride: 98 mmol/L (ref 98–111)
Creatinine, Ser: 0.71 mg/dL (ref 0.44–1.00)
GFR calc Af Amer: >60 mL/min (ref >60)
GFR calc non Af Amer: >60 mL/min (ref >60)
Glucose, Bld: 100 mg/dL — ABNORMAL HIGH (ref 70–99)
POTASSIUM: 2.9 mmol/L — AB (ref 3.5–5.1)
Sodium: 133 mmol/L — ABNORMAL LOW (ref 135–145)

## 2018-04-16 LAB — URINALYSIS, COMPLETE (UACMP) WITH MICROSCOPIC
BACTERIA UA: NONE SEEN
Bilirubin Urine: NEGATIVE
Glucose, UA: NEGATIVE mg/dL
Hgb urine dipstick: NEGATIVE
Ketones, ur: NEGATIVE mg/dL
Leukocytes, UA: NEGATIVE
Nitrite: NEGATIVE
Protein, ur: NEGATIVE mg/dL
SPECIFIC GRAVITY, URINE: 1.014 (ref 1.005–1.030)
pH: 5 (ref 5.0–8.0)

## 2018-04-16 LAB — INFLUENZA PANEL BY PCR (TYPE A & B)
Influenza A By PCR: NEGATIVE
Influenza B By PCR: NEGATIVE

## 2018-04-16 LAB — TSH: TSH: 0.348 u[IU]/mL — ABNORMAL LOW (ref 0.350–4.500)

## 2018-04-16 MED ORDER — POTASSIUM CHLORIDE CRYS ER 20 MEQ PO TBCR
40.0000 meq | EXTENDED_RELEASE_TABLET | ORAL | Status: AC
  Administered 2018-04-16 – 2018-04-17 (×2): 40 meq via ORAL
  Filled 2018-04-16 (×2): qty 2

## 2018-04-16 MED ORDER — POLYETHYLENE GLYCOL 3350 17 G PO PACK
17.00 | PACK | ORAL | Status: DC
Start: 2018-04-16 — End: 2018-04-16

## 2018-04-16 MED ORDER — FIRST-MOUTHWASH BLM MT SUSP
5.00 | OROMUCOSAL | Status: DC
Start: ? — End: 2018-04-16

## 2018-04-16 MED ORDER — SENNOSIDES-DOCUSATE SODIUM 8.6-50 MG PO TABS
2.00 | ORAL_TABLET | ORAL | Status: DC
Start: 2018-04-16 — End: 2018-04-16

## 2018-04-16 MED ORDER — ACYCLOVIR 200 MG PO CAPS
400.00 | ORAL_CAPSULE | ORAL | Status: DC
Start: 2018-04-15 — End: 2018-04-16

## 2018-04-16 MED ORDER — POTASSIUM CHLORIDE ER 20 MEQ PO TBCR
40.00 | EXTENDED_RELEASE_TABLET | ORAL | Status: DC
Start: 2018-04-16 — End: 2018-04-16

## 2018-04-16 MED ORDER — LORAZEPAM 1 MG PO TABS
1.00 | ORAL_TABLET | ORAL | Status: DC
Start: ? — End: 2018-04-16

## 2018-04-16 MED ORDER — HYDROCHLOROTHIAZIDE 25 MG PO TABS
12.50 | ORAL_TABLET | ORAL | Status: DC
Start: 2018-04-16 — End: 2018-04-16

## 2018-04-16 MED ORDER — HYDROCHLOROTHIAZIDE 25 MG PO TABS
12.5000 mg | ORAL_TABLET | Freq: Every day | ORAL | Status: DC
  Administered 2018-04-17 – 2018-04-20 (×4): 12.5 mg via ORAL
  Filled 2018-04-16 (×4): qty 1

## 2018-04-16 MED ORDER — VANCOMYCIN HCL IN DEXTROSE 1-5 GM/200ML-% IV SOLN
1000.0000 mg | Freq: Once | INTRAVENOUS | Status: DC
  Filled 2018-04-16: qty 200

## 2018-04-16 MED ORDER — ACYCLOVIR 200 MG PO CAPS
200.0000 mg | ORAL_CAPSULE | Freq: Two times a day (BID) | ORAL | Status: DC
  Administered 2018-04-16 – 2018-04-20 (×8): 200 mg via ORAL
  Filled 2018-04-16 (×9): qty 1

## 2018-04-16 MED ORDER — LOSARTAN POTASSIUM 50 MG PO TABS
100.0000 mg | ORAL_TABLET | Freq: Every day | ORAL | Status: DC
  Administered 2018-04-17 – 2018-04-20 (×4): 100 mg via ORAL
  Filled 2018-04-16 (×4): qty 2

## 2018-04-16 MED ORDER — VITAMIN D (ERGOCALCIFEROL) 1.25 MG (50000 UNIT) PO CAPS
50000.0000 [IU] | ORAL_CAPSULE | ORAL | Status: DC
  Administered 2018-04-17: 15:00:00 50000 [IU] via ORAL
  Filled 2018-04-16: qty 1

## 2018-04-16 MED ORDER — POTASSIUM CHLORIDE CRYS ER 20 MEQ PO TBCR
20.0000 meq | EXTENDED_RELEASE_TABLET | Freq: Two times a day (BID) | ORAL | Status: DC
  Administered 2018-04-16 – 2018-04-17 (×3): 20 meq via ORAL
  Filled 2018-04-16 (×3): qty 1

## 2018-04-16 MED ORDER — SODIUM CHLORIDE 0.9 % IV SOLN
2.0000 g | Freq: Once | INTRAVENOUS | Status: AC
  Administered 2018-04-16: 2 g via INTRAVENOUS
  Filled 2018-04-16: qty 2

## 2018-04-16 MED ORDER — LORATADINE 10 MG PO TABS: 10.0000 mg | ORAL_TABLET | Freq: Every day | ORAL | Status: DC | PRN

## 2018-04-16 MED ORDER — VANCOMYCIN HCL 10 G IV SOLR
2000.0000 mg | Freq: Once | INTRAVENOUS | Status: AC
  Administered 2018-04-16: 23:00:00 2000 mg via INTRAVENOUS
  Filled 2018-04-16: qty 2000

## 2018-04-16 MED ORDER — POTASSIUM CHLORIDE CRYS ER 20 MEQ PO TBCR: 40.0000 meq | EXTENDED_RELEASE_TABLET | ORAL | Status: DC

## 2018-04-16 MED ORDER — MORPHINE SULFATE (PF) 2 MG/ML IV SOLN
2.00 | INTRAVENOUS | Status: DC
Start: ? — End: 2018-04-16

## 2018-04-16 MED ORDER — VANCOMYCIN HCL 10 G IV SOLR
1250.0000 mg | Freq: Two times a day (BID) | INTRAVENOUS | Status: DC
  Administered 2018-04-17 (×2): 1250 mg via INTRAVENOUS
  Filled 2018-04-16 (×4): qty 1250

## 2018-04-16 MED ORDER — SODIUM CHLORIDE 0.9 % IV SOLN
INTRAVENOUS | Status: AC
  Administered 2018-04-16: 23:00:00 via INTRAVENOUS

## 2018-04-16 MED ORDER — APIXABAN 5 MG PO TABS
5.0000 mg | ORAL_TABLET | Freq: Two times a day (BID) | ORAL | Status: DC
  Administered 2018-04-16 – 2018-04-20 (×8): 5 mg via ORAL
  Filled 2018-04-16 (×8): qty 1

## 2018-04-16 MED ORDER — APIXABAN 5 MG PO TABS
5.00 | ORAL_TABLET | ORAL | Status: DC
Start: 2018-04-15 — End: 2018-04-16

## 2018-04-16 MED ORDER — CODEINE SULFATE 15 MG PO TABS
7.50 | ORAL_TABLET | ORAL | Status: DC
Start: ? — End: 2018-04-16

## 2018-04-16 MED ORDER — ACETAMINOPHEN 325 MG PO TABS
650.0000 mg | ORAL_TABLET | Freq: Four times a day (QID) | ORAL | Status: DC | PRN
  Administered 2018-04-17 – 2018-04-18 (×2): 650 mg via ORAL
  Filled 2018-04-16 (×2): qty 2

## 2018-04-16 MED ORDER — PROCHLORPERAZINE MALEATE 10 MG PO TABS
10.00 | ORAL_TABLET | ORAL | Status: DC
Start: ? — End: 2018-04-16

## 2018-04-16 MED ORDER — ONDANSETRON HCL 4 MG PO TABS: 4.0000 mg | ORAL_TABLET | Freq: Four times a day (QID) | ORAL | Status: DC | PRN

## 2018-04-16 MED ORDER — ONDANSETRON HCL 4 MG/2ML IJ SOLN: 4.0000 mg | Freq: Four times a day (QID) | INTRAMUSCULAR | Status: DC | PRN

## 2018-04-16 MED ORDER — ALLOPURINOL 300 MG PO TABS
300.00 | ORAL_TABLET | ORAL | Status: DC
Start: 2018-04-16 — End: 2018-04-16

## 2018-04-16 MED ORDER — ACETAMINOPHEN 650 MG RE SUPP: 650.0000 mg | Freq: Four times a day (QID) | RECTAL | Status: DC | PRN

## 2018-04-16 MED ORDER — SODIUM CHLORIDE 0.9 % IV SOLN
2.0000 g | Freq: Three times a day (TID) | INTRAVENOUS | Status: DC
  Administered 2018-04-17 – 2018-04-20 (×10): 2 g via INTRAVENOUS
  Filled 2018-04-16 (×14): qty 2

## 2018-04-16 MED ORDER — POLYETHYLENE GLYCOL 3350 17 G PO PACK
17.0000 g | PACK | Freq: Every day | ORAL | Status: DC
  Administered 2018-04-17 – 2018-04-18 (×2): 17 g via ORAL
  Filled 2018-04-16 (×4): qty 1

## 2018-04-16 MED ORDER — SENNOSIDES-DOCUSATE SODIUM 8.6-50 MG PO TABS
2.0000 | ORAL_TABLET | Freq: Two times a day (BID) | ORAL | Status: DC
  Administered 2018-04-16 – 2018-04-18 (×4): 2 via ORAL
  Filled 2018-04-16 (×7): qty 2

## 2018-04-16 MED ORDER — GENERIC EXTERNAL MEDICATION
10.00 | Status: DC
Start: ? — End: 2018-04-16

## 2018-04-16 MED ORDER — LORAZEPAM 2 MG/ML IJ SOLN
1.00 | INTRAMUSCULAR | Status: DC
Start: ? — End: 2018-04-16

## 2018-04-16 MED ORDER — LOSARTAN POTASSIUM 50 MG PO TABS
100.00 | ORAL_TABLET | ORAL | Status: DC
Start: 2018-04-16 — End: 2018-04-16

## 2018-04-16 NOTE — H&P (Addendum)
[place] at [place] NAME: Brianna Chung    MR#:  [id number]  DATE OF BIRTH:  08/02/1960  DATE OF ADMISSION:  04/16/2018  PRIMARY CARE PHYSICIAN: [facility], [place]   REQUESTING/REFERRING PHYSICIAN: Dr. Nance Pear  CHIEF COMPLAINT:   Chief Complaint  Patient presents with  . Fever    HISTORY OF PRESENT ILLNESS:  Brianna Chung  is a 58 y.o. 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 Patient was having fevers and cough about 3 months ago and was diagnosed to have a left upper lobe lung mass and mediastinal mass which were diagnosed after biopsy to be large B-cell lymphoma.  She was admitted to the hospital 2 weeks ago for left upper extremity swelling and was noted to have DVT and was discharged on Eliquis.  She has seen her oncologist last week and has received her first dose of chemootherapy with R-EPOCH.  She is supposed to follow-up to get the Neulasta injection in 2 days.  This morning when she woke up, she felt unusually tired, was having chills and low-grade fever at home and so presented to the emergency room.  Denies any sick contacts, no recent travel.  No abdominal pain, nausea vomiting or urinary complaints.  Labs here show a white count of 7.5 with neutrophilia, hypokalemia and chest x-ray with mild left lower lobe infiltrate. She is also noted to be febrile and tachycardic.  PAST MEDICAL HISTORY:   Past Medical History:  Diagnosis Date  . Hypertension   . 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 biospy     lung mass and mediastinal mass  . PORTACATH PLACEMENT      SOCIAL HISTORY:   Social History   Tobacco Use  . Smoking status: Never Smoker  . Smokeless tobacco: Never Used    Substance Use Topics  . Alcohol use: Yes    Alcohol/week: 1.0 standard drinks    Types: 1 Standard drinks or equivalent per week    Comment: margarita    FAMILY HISTORY:   Family History  Problem Relation Age of Onset  . Hypertension Mother   . Hypertension Father   . Lung cancer Brother     DRUG ALLERGIES:  No Known Allergies  REVIEW OF SYSTEMS:   Review of Systems  Constitutional: Positive for chills, fever and malaise/fatigue. Negative for weight loss.  HENT: Negative for ear discharge, ear pain, hearing loss, nosebleeds and tinnitus.   Eyes: Negative for blurred vision, double vision and photophobia.  Respiratory: Positive for cough. Negative for hemoptysis, shortness of breath and wheezing.   Cardiovascular: Negative for chest pain, palpitations, orthopnea and leg swelling.  Gastrointestinal: Negative for abdominal pain, constipation, diarrhea, heartburn, melena, nausea and vomiting.  Genitourinary: Negative for dysuria, frequency, hematuria and urgency.  Musculoskeletal: Positive for myalgias. Negative for back pain and neck pain.  Skin: Negative for rash.  Neurological: Negative for dizziness, tingling, tremors, sensory change, speech change, focal weakness and headaches.  Endo/Heme/Allergies: Does not bruise/bleed easily.  Psychiatric/Behavioral: Negative for depression.    MEDICATIONS AT HOME:   Prior to Admission medications   Medication Sig Start Date End Date Taking? Authorizing Provider  acyclovir (ZOVIRAX) 200 MG capsule Take 200 mg by mouth 2 (two) times daily.  04/14/18  Yes [provider]  apixaban (ELIQUIS) 5 MG TABS tablet Take 1 tablet (5 mg total) by mouth 2 (two) times daily. 04/09/18  Yes Bettey Costa, MD  DPH-[facility]-AlHydr-MgHydr-Simeth (FIRST-MOUTHWASH BLM) SUSP Swish and spit 5 mLs every 6 (six) hours as needed (Oral ulcers and pain) 04/14/18  Yes [provider]  hydrochlorothiazide (HYDRODIURIL) 12.5 MG tablet Take 12.5 mg by mouth  daily. 09/28/17  Yes [provider]  loratadine (CLARITIN) 10 MG tablet Take 10 mg by mouth daily as needed for allergies.   Yes [provider]  losartan (COZAAR) 100 MG tablet Take 100 mg by mouth daily. 02/10/18 02/10/19 Yes [provider]  ondansetron (ZOFRAN) 4 MG tablet Take 4 mg by mouth every 8 (eight) hours as needed. 04/14/18  Yes [provider]  polyethylene glycol (MIRALAX / GLYCOLAX) packet Take 17 g by mouth daily. 04/15/18  Yes [provider]  potassium chloride SA (K-DUR,KLOR-CON) 20 MEQ tablet Take 20 mEq by mouth 2 (two) times daily. 07/13/17  Yes [provider]  senna-docusate (SENOKOT-S) 8.6-50 MG tablet Take 2 tablets by mouth 2 (two) times daily. 04/14/18 04/14/19 Yes [provider]  Vitamin D, Ergocalciferol, (DRISDOL) 1.25 MG (50000 UT) CAPS capsule Take 1 capsule by mouth every Sunday. 03/17/18 04/16/18 Yes [provider]      VITAL SIGNS:  Blood pressure (!) 146/84, pulse 85, temperature 99.8 F (37.7 C), temperature source Oral, resp. rate 19, height 5\' 5"  (1.651 m), weight 92.1 kg, last menstrual period 03/16/2018, SpO2 96 %.  PHYSICAL EXAMINATION:   Physical Exam  GENERAL:  58 y.o.-year-old patient lying in the bed with no acute distress.  EYES: Pupils equal, round, reactive to light and accommodation. No scleral icterus. Extraocular muscles intact.  HEENT: Head atraumatic, normocephalic. Oropharynx and nasopharynx clear. No oral thrush noted. NECK:  Supple, no jugular venous distention. No thyroid enlargement, no tenderness.  LUNGS: Normal breath sounds bilaterally, no wheezing, rales,rhonchi or crepitation. No use of accessory muscles of respiration. Decreased bibasilar breath sounds CARDIOVASCULAR: S1, S2 normal. No murmurs, rubs, or gallops.  ABDOMEN: Soft, nontender, nondistended. Bowel sounds present. No organomegaly or mass.  EXTREMITIES: No pedal edema, cyanosis, or clubbing.  NEUROLOGIC:  Cranial nerves II through XII are intact. Muscle strength 5/5 in all extremities. Sensation intact. Gait not checked.  PSYCHIATRIC: The patient is alert and oriented x 3.  SKIN: No obvious rash, lesion, or ulcer.   LABORATORY PANEL:   CBC Recent Labs  Lab 04/16/18 1617  WBC 7.5  HGB 10.4*  HCT 30.2*  PLT 314   ------------------------------------------------------------------------------------------------------------------  Chemistries  Recent Labs  Lab 04/16/18 1617  NA 133*  K 2.9*  CL 98  CO2 28  GLUCOSE 100*  BUN 22*  CREATININE 0.71  CALCIUM 8.6*   ------------------------------------------------------------------------------------------------------------------  Cardiac Enzymes No results for input(s): TROPONINI in the last 168 hours. ------------------------------------------------------------------------------------------------------------------  RADIOLOGY:  Dg Chest 2 View  Result Date: 04/16/2018 CLINICAL DATA:  Fever, history of lymphoma. EXAM: CHEST - 2 VIEW COMPARISON:  CT scan of April 01, 2018. FINDINGS: Mild cardiomegaly is noted. Right internal jugular Port-A-Cath is unchanged in position. Large left upper lobe mass is again noted consistent with history of lymphoma. Right lung is clear. Mild left pleural effusion is noted with probable underlying atelectasis or infiltrate. Bony thorax is unremarkable. IMPRESSION: Mild left pleural effusion is noted with probable underlying atelectasis or infiltrate. Large left upper lobe mass is again noted consistent with history of lymphoma. Electronically  Signed   By: Marijo Conception, M.D.   On: 04/16/2018 18:40    EKG:  No orders found for this or any previous visit.  IMPRESSION AND PLAN:   Brianna Chung  is a 58 y.o. 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  1.   Early sepsis-white count is low due to recent chemotherapy, has predominant neutrophilia -Has low-grade fever, tachypnea and tachycardia. -Chest x-ray with left lower lobe infiltrate. -Admit, blood cultures, check urine analysis.  Started on vancomycin and cefepime due to her recent hospitalization and and immunosuppressive therapy -Influenza panel and respiratory virus panel are pending  2.  Hypokalemia-being replaced  3.  Hypertension-on losartan and hydrochlorothiazide.  4.  Left upper extremity DVT-continue Eliquis.   5.  Large B-cell lymphoma-just started her first dose of chemotherapy with R-EPOCH. -Received last week.  On acyclovir for prophylaxis.  Which we will continue  Encourage ambulation.  Patient is independent at baseline    All the records are reviewed and case discussed with ED provider. Management plans discussed with the patient, family and they are in agreement.  CODE STATUS: Full Code  TOTAL TIME TAKING CARE OF THIS PATIENT: 51 minutes.    Gladstone Lighter M.D on 04/16/2018 at 8:34 PM  Between 7am to 6pm - Pager - [phone]  After 6pm go to [email or web address] - password [email or web address] [facility]  Office  [phone]  CC: Primary care physician; Center, [facility]

## 2018-04-16 NOTE — ED Notes (Signed)
Assisted pt to bathroom. Pt back in bed resting. 

## 2018-04-16 NOTE — ED Triage Notes (Signed)
Fever today, nausea. No other symptoms. No medications pta

## 2018-04-16 NOTE — ED Provider Notes (Signed)
[facility] Emergency Department Provider Note   ____________________________________________   I have reviewed the triage vital signs and the nursing notes.   HISTORY  Chief Complaint Fever   History limited by: Not Limited   HPI Brianna Chung is a 58 y.o. female who presents to the emergency department today because of concerns for fever.  Patient states that it started today.  It was greater than 100.4.  Patient recently was discharged from the hospital secondary to DVT.  She states she did just finish her first round of chemotherapy for lymphoma.  The patient has had some nausea. She denies taking any medication for fever.   Per medical record review patient has a history of HTN, recent admission DVT.   Past Medical History:  Diagnosis Date  . Hypertension     Patient Active Problem List   Diagnosis Date Noted  . 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 Acute deep vein thrombosis (DVT) of left upper extremity ([place]) 04/01/2018  . HTN (hypertension) 04/01/2018    Past Surgical History:  Procedure Laterality Date  . biospy      Prior to Admission medications   Medication Sig Start Date End Date Taking? Authorizing Provider  apixaban (ELIQUIS) 5 MG TABS tablet Take 2 tablets (10 mg total) by mouth 2 (two) times daily for 6 days. 04/02/18 04/08/18  Bettey Costa, MD  apixaban (ELIQUIS) 5 MG TABS tablet Take 1 tablet (5 mg total) by mouth 2 (two) times daily. 04/09/18   Bettey Costa, MD  hydrochlorothiazide (HYDRODIURIL) 12.5 MG tablet Take 12.5 mg by mouth daily. 09/28/17   [provider]  losartan (COZAAR) 100 MG tablet Take 100 mg by mouth daily. 02/10/18 02/10/19  [provider]  potassium chloride SA (K-DUR,KLOR-CON) 20 MEQ tablet Take 20 mEq by mouth 2 (two) times daily. 07/13/17   [provider]  Vitamin D, Ergocalciferol, (DRISDOL) 1.25 MG (50000 UT) CAPS capsule Take 1 capsule by mouth every Sunday. 03/17/18 04/16/18  [provider]    Allergies Patient has no known allergies.  Family History  Problem Relation Age of Onset  . Hypertension Mother   . Hypertension Father     Social History Social History   Tobacco Use  . Smoking status: Never Smoker  . Smokeless tobacco: Never Used  Substance Use Topics  . Alcohol use: Yes    Alcohol/week: 1.0 standard drinks    Types: 1 Standard drinks or equivalent per week    Comment: margarita  . Drug use: Not Currently    Review of Systems Constitutional: Positive for fever Eyes: No visual changes. ENT: No sore throat. Cardiovascular: Denies chest pain. Respiratory: Denies shortness of breath. Gastrointestinal: No abdominal pain.  Positive for nausea.  Genitourinary: Negative for dysuria. Musculoskeletal: Negative for back pain. Skin: Negative for rash. Neurological: Negative for headaches, focal weakness or numbness.  ____________________________________________   PHYSICAL EXAM:  VITAL SIGNS: ED Triage Vitals  Enc Vitals Group     BP 04/16/18 1517 126/83     Pulse Rate 04/16/18 1517 (!) 111     Resp --      Temp 04/16/18 1517 100.2 F (37.9 C)     Temp Source 04/16/18 1517 Oral     SpO2 04/16/18 1517 95 %     Weight 04/16/18 1518 203 lb (92.1 kg)     Height 04/16/18 1518 5\' 5"  (1.651 m)   Constitutional: Alert and oriented.  Eyes: Conjunctivae are normal.  ENT  Head: Normocephalic and atraumatic.      Nose: No congestion/rhinnorhea.      Mouth/Throat: Mucous membranes are moist.      Neck: No stridor. Hematological/Lymphatic/Immunilogical: No cervical lymphadenopathy. Cardiovascular: Tachycardic, regular rhythm.  No murmurs, rubs, or gallops.  Respiratory: Normal respiratory effort without tachypnea nor retractions. Breath sounds are clear and equal bilaterally. No wheezes/rales/rhonchi. Gastrointestinal: Soft and non tender. No rebound. No guarding.  Genitourinary: Deferred Musculoskeletal: Normal range of motion in all  extremities. No lower extremity edema. Neurologic:  Normal speech and language. No gross focal neurologic deficits are appreciated.  Skin:  Skin is warm, dry and intact. No rash noted. Psychiatric: Mood and affect are normal. Speech and behavior are normal. Patient exhibits appropriate insight and judgment.  ____________________________________________    LABS (pertinent positives/negatives)  CBC wbc 7.5, hgb 10.4, plt 314 BMP na 133, k 2.9, glu 100, bun 22, cr 0.71  ____________________________________________   EKG  None  ____________________________________________    RADIOLOGY  CXR Concern for infiltrate  ____________________________________________   PROCEDURES  Procedures  ____________________________________________   INITIAL IMPRESSION / ASSESSMENT AND PLAN / ED COURSE  Pertinent labs & imaging results that were available during my care of the patient were reviewed by me and considered in my medical decision making (see chart for details).   Patient presented to the emergency department today because of concerns for fever.  Patient did recently finished chemotherapy.  Blood work however without neutropenia.  Patient was slightly tachypneic and tachycardic here in the emergency department.  Patient's chest x-ray is concerning for possible pneumonia.  Given recent hospitalization will treat.  Discussed findings and plan with patient.   ____________________________________________   FINAL CLINICAL IMPRESSION(S) / ED DIAGNOSES  Final diagnoses:  Fever, unspecified fever cause  Pneumonia due to infectious organism, unspecified laterality, unspecified part of lung     Note: This dictation was prepared with Dragon dictation. Any transcriptional errors that result from this process are unintentional     Nance Pear, MD 04/16/18 1941

## 2018-04-16 NOTE — Progress Notes (Signed)
Pharmacy Antibiotic Note  Brianna Chung is a 58 y.o. female admitted on 04/16/2018 with pneumonia.  Pharmacy has been consulted for cefepime and vancomyin dosing.  Plan: Will start cefepime 2g q8H.   Order vancomycin 2000 mg x 1 loading dose. Will order a maintenance dose of vancomycin 1250 mg q12H. Predicted AUC 529. Goal AUC is 400-550. Scr 0.71. Plan to order levels on the 4th or 5th day. MRSA PCR ordered. Recommend to d/c if it returns negative.   Height: 5\' 5"  (165.1 cm) Weight: 203 lb (92.1 kg) IBW/kg (Calculated) : 57  Temp (24hrs), Avg:99.8 F (37.7 C), Min:99.4 F (37.4 C), Max:100.2 F (37.9 C)  Recent Labs  Lab 04/16/18 1617  WBC 7.5  CREATININE 0.71    Estimated Creatinine Clearance: 89.1 mL/min (by C-G formula based on SCr of 0.71 mg/dL).    No Known Allergies  Antimicrobials this admission: 2/8 cefepime >>  2/8 vancomycin  >>   Dose adjustments this admission: none  Microbiology results: 2/8 BCx: pending   Thank you for allowing pharmacy to be a part of this patient's care.  Oswald Hillock, PharmD, BCPS 04/16/2018 9:33 PM

## 2018-04-16 NOTE — ED Notes (Signed)
ED TO INPATIENT HANDOFF REPORT  Name/Age/Gender Brianna Chung 58 y.o. female  Code Status Code Status History    Date Active Date Inactive Code Status Order ID Comments User Context   04/01/2018 2236 04/02/2018 1937 Full Code [id number]  Lance Coon, MD Inpatient      Home/SNF/Other home  Chief Complaint poss fever  Level of Care/Admitting Diagnosis ED Disposition    ED Disposition Condition [place]: [place] [371696]  Level of Care: Med-Surg [16]  Diagnosis: Pneumonia [789381]  Admitting Physician: Gladstone Lighter [[id number]]  Attending Physician: Gladstone Lighter [[id number]]  Estimated length of stay: past midnight tomorrow  Certification:: I certify this patient will need inpatient services for at least 2 midnights  Bed request comments: needs 1 C bed  PT Class (Do Not Modify): Inpatient [101]  PT Acc Code (Do Not Modify): Private [1]       Medical History Past Medical History:  Diagnosis Date  . Hypertension   . 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[place])     Allergies No Known Allergies  IV Location/Drains/Wounds Patient Lines/Drains/Airways Status   Active Line/Drains/Airways    Name:   Placement date:   Placement time:   Site:   Days:   Peripheral IV 04/01/18 Right Antecubital   04/01/18    1857    Antecubital   15   Peripheral IV 04/16/18 Right Antecubital   04/16/18    1623    Antecubital   less than 1          Labs/Imaging Results for orders placed or performed during the hospital encounter of 04/16/18 (from the past 48 hour(s))  CBC with Differential     Status: Abnormal   Collection Time: 04/16/18  4:17 PM  Result Value Ref Range   WBC 7.5 4.0 - 10.5 K/uL   RBC 3.65 (L) 3.87 - 5.11 MIL/uL   Hemoglobin 10.4 (L) 12.0 - 15.0 g/dL   HCT 30.2 (L) 36.0 - 46.0 %   MCV 82.7 80.0 - 100.0 fL   MCH 28.5 26.0 - 34.0 pg   MCHC 34.4 30.0 - 36.0 g/dL   RDW 12.2 11.5 - 15.5 %   Platelets 314 150 - 400 K/uL   nRBC 0.0  0.0 - 0.2 %   Neutrophils Relative % 96 %   Neutro Abs 7.2 1.7 - 7.7 K/uL   Lymphocytes Relative 3 %   Lymphs Abs 0.2 (L) 0.7 - 4.0 K/uL   Monocytes Relative 0 %   Monocytes Absolute 0.0 (L) 0.1 - 1.0 K/uL   Eosinophils Relative 0 %   Eosinophils Absolute 0.0 0.0 - 0.5 K/uL   Basophils Relative 0 %   Basophils Absolute 0.0 0.0 - 0.1 K/uL   Immature Granulocytes 1 %   Abs Immature Granulocytes 0.06 0.00 - 0.07 K/uL    Comment: Performed at [facility], [address] [place], [place] [address]  Basic metabolic panel     Status: Abnormal   Collection Time: 04/16/18  4:17 PM  Result Value Ref Range   Sodium 133 (L) 135 - 145 mmol/L   Potassium 2.9 (L) 3.5 - 5.1 mmol/L   Chloride 98 98 - 111 mmol/L   CO2 28 22 - 32 mmol/L   Glucose, Bld 100 (H) 70 - 99 mg/dL   BUN 22 (H) 6 - 20 mg/dL   Creatinine, Ser 0.71 0.44 - 1.00 mg/dL   Calcium 8.6 (L) 8.9 - 10.3 mg/dL   GFR calc  non Af Amer >60 >60 mL/min   GFR calc Af Amer >60 >60 mL/min   Anion gap 7 5 - 15    Comment: Performed at [facility], [address] [place], [place] [address]  Urinalysis, Complete w Microscopic     Status: Abnormal   Collection Time: 04/16/18  5:34 PM  Result Value Ref Range   Color, Urine YELLOW (A) YELLOW   APPearance CLEAR (A) CLEAR   Specific Gravity, Urine 1.014 1.005 - 1.030   pH 5.0 5.0 - 8.0   Glucose, UA NEGATIVE NEGATIVE mg/dL   Hgb urine dipstick NEGATIVE NEGATIVE   Bilirubin Urine NEGATIVE NEGATIVE   Ketones, ur NEGATIVE NEGATIVE mg/dL   Protein, ur NEGATIVE NEGATIVE mg/dL   Nitrite NEGATIVE NEGATIVE   Leukocytes, UA NEGATIVE NEGATIVE   RBC / HPF 0-5 0 - 5 RBC/hpf   WBC, UA 0-5 0 - 5 WBC/hpf   Bacteria, UA NONE SEEN NONE SEEN   Squamous Epithelial / LPF 0-5 0 - 5   Mucus PRESENT     Comment: Performed at [facility], [address] [place], [place] [address]   Dg Chest 2 View  Result Date: 04/16/2018 CLINICAL DATA:  Fever, history of lymphoma. EXAM:  CHEST - 2 VIEW COMPARISON:  CT scan of April 01, 2018. FINDINGS: Mild cardiomegaly is noted. Right internal jugular Port-A-Cath is unchanged in position. Large left upper lobe mass is again noted consistent with history of lymphoma. Right lung is clear. Mild left pleural effusion is noted with probable underlying atelectasis or infiltrate. Bony thorax is unremarkable. IMPRESSION: Mild left pleural effusion is noted with probable underlying atelectasis or infiltrate. Large left upper lobe mass is again noted consistent with history of lymphoma. Electronically Signed   By: Marijo Conception, M.D.   On: 04/16/2018 18:40    Pending Labs Unresulted Labs (From admission, onward)    Start     Ordered   04/16/18 2020  Influenza panel by PCR (type A & B)  (Influenza PCR Panel)  Once,   STAT     04/16/18 2019   04/16/18 2020  Respiratory Panel by PCR  (Respiratory virus panel with precautions)  Once,   STAT     04/16/18 2019   04/16/18 1913  Blood culture (routine x 2)  BLOOD CULTURE X 2,   STAT     04/16/18 1912   Signed and Held  TSH  Once,   R     Signed and Held   Signed and Held  Basic metabolic panel  Tomorrow morning,   R     Signed and Held   Signed and Held  MRSA PCR Screening  Once,   R     Signed and Held          Vitals/Pain Today's Vitals   04/16/18 1730 04/16/18 1848 04/16/18 1930 04/16/18 2000  BP: (!) 143/83 136/88 (!) 155/88 (!) 146/84  Pulse: 95 99 88 85  Resp: (!) 24 (!) 22 (!) 25 19  Temp:  99.8 F (37.7 C)    TempSrc:  Oral    SpO2: 96% 96% 97% 96%  Weight:      Height:      PainSc:  0-No pain      Isolation Precautions Droplet precaution  Medications Medications  potassium chloride SA (K-DUR,KLOR-CON) CR tablet 40 mEq (has no administration in time range)  vancomycin (VANCOCIN) 2,000 mg in sodium chloride 0.9 % 500 mL IVPB (has no administration in  time range)  ceFEPIme (MAXIPIME) 2 g in sodium chloride 0.9 % 100 mL IVPB (2 g Intravenous New Bag/Given 04/16/18  2030)    Mobility walks

## 2018-04-16 NOTE — ED Notes (Signed)
Patient transported to X-ray 

## 2018-04-17 LAB — BASIC METABOLIC PANEL
Anion gap: 6 (ref 5–15)
BUN: 15 mg/dL (ref 6–20)
CO2: 28 mmol/L (ref 22–32)
Calcium: 8.3 mg/dL — ABNORMAL LOW (ref 8.9–10.3)
Chloride: 100 mmol/L (ref 98–111)
Creatinine, Ser: 0.7 mg/dL (ref 0.44–1.00)
GFR calc non Af Amer: >60 mL/min (ref >60)
Glucose, Bld: 100 mg/dL — ABNORMAL HIGH (ref 70–99)
Potassium: 3 mmol/L — ABNORMAL LOW (ref 3.5–5.1)
Sodium: 134 mmol/L — ABNORMAL LOW (ref 135–145)

## 2018-04-17 LAB — RESPIRATORY PANEL BY PCR
ADENOVIRUS-RVPPCR: NOT DETECTED
Bordetella pertussis: NOT DETECTED
CHLAMYDOPHILA PNEUMONIAE-RVPPCR: NOT DETECTED
Coronavirus 229E: NOT DETECTED
Coronavirus HKU1: NOT DETECTED
Coronavirus NL63: NOT DETECTED
Coronavirus OC43: NOT DETECTED
Influenza A: NOT DETECTED
Influenza B: NOT DETECTED
MYCOPLASMA PNEUMONIAE-RVPPCR: NOT DETECTED
Metapneumovirus: NOT DETECTED
Parainfluenza Virus 1: NOT DETECTED
Parainfluenza Virus 2: NOT DETECTED
Parainfluenza Virus 3: NOT DETECTED
Parainfluenza Virus 4: NOT DETECTED
Respiratory Syncytial Virus: NOT DETECTED
Rhinovirus / Enterovirus: NOT DETECTED

## 2018-04-17 LAB — MRSA PCR SCREENING: MRSA by PCR: NEGATIVE

## 2018-04-17 MED ORDER — SODIUM CHLORIDE 0.9% FLUSH: 10.0000 mL | INTRAVENOUS | Status: DC | PRN

## 2018-04-17 NOTE — Progress Notes (Addendum)
[place] at [place] NAME: Brianna Chung    MR#:  [id number]  DATE OF BIRTH:  Nov 19, 1960  SUBJECTIVE:  CHIEF COMPLAINT: Patient is resting comfortably but reporting exertional shortness of breath  REVIEW OF SYSTEMS:  CONSTITUTIONAL: No fever, fatigue or weakness.  EYES: No blurred or double vision.  EARS, NOSE, AND THROAT: No tinnitus or ear pain.  RESPIRATORY: No cough, shortness of breath, wheezing or hemoptysis.  CARDIOVASCULAR: No chest pain, orthopnea, edema.  GASTROINTESTINAL: No nausea, vomiting, diarrhea or abdominal pain.  GENITOURINARY: No dysuria, hematuria.  ENDOCRINE: No polyuria, nocturia,  HEMATOLOGY: No anemia, easy bruising or bleeding SKIN: No rash or lesion. MUSCULOSKELETAL: No joint pain or arthritis.   NEUROLOGIC: No tingling, numbness, weakness.  PSYCHIATRY: No anxiety or depression.   DRUG ALLERGIES:  No Known Allergies  VITALS:  Blood pressure 106/66, pulse 93, temperature 98.4 F (36.9 C), temperature source Oral, resp. rate 18, height 5\' 5"  (1.651 m), weight 90.6 kg, last menstrual period 03/16/2018, SpO2 95 %.  PHYSICAL EXAMINATION:  GENERAL:  58 y.o.-year-old patient lying in the bed with no acute distress.  EYES: Pupils equal, round, reactive to light and accommodation. No scleral icterus. Extraocular muscles intact.  HEENT: Head atraumatic, normocephalic. Oropharynx and nasopharynx clear.  NECK:  Supple, no jugular venous distention. No thyroid enlargement, no tenderness.  LUNGS: Normal breath sounds bilaterally, no wheezing, rales,rhonchi or crepitation. No use of accessory muscles of respiration.  CARDIOVASCULAR: S1, S2 normal. No murmurs, rubs, or gallops.  ABDOMEN: Soft, nontender, nondistended. Bowel sounds present. No organomegaly or mass.  EXTREMITIES: No pedal edema, cyanosis, or clubbing.  NEUROLOGIC: Cranial nerves II through XII are intact. Muscle strength 5/5 in all extremities.  Sensation intact. Gait not checked.  PSYCHIATRIC: The patient is alert and oriented x 3.  SKIN: No obvious rash, lesion, or ulcer.    LABORATORY PANEL:   CBC Recent Labs  Lab 04/16/18 1617  WBC 7.5  HGB 10.4*  HCT 30.2*  PLT 314   ------------------------------------------------------------------------------------------------------------------  Chemistries  Recent Labs  Lab 04/17/18 0553  NA 134*  K 3.0*  CL 100  CO2 28  GLUCOSE 100*  BUN 15  CREATININE 0.70  CALCIUM 8.3*   ------------------------------------------------------------------------------------------------------------------  Cardiac Enzymes No results for input(s): TROPONINI in the last 168 hours. ------------------------------------------------------------------------------------------------------------------  RADIOLOGY:  Dg Chest 2 View  Result Date: 04/16/2018 CLINICAL DATA:  Fever, history of lymphoma. EXAM: CHEST - 2 VIEW COMPARISON:  CT scan of April 01, 2018. FINDINGS: Mild cardiomegaly is noted. Right internal jugular Port-A-Cath is unchanged in position. Large left upper lobe mass is again noted consistent with history of lymphoma. Right lung is clear. Mild left pleural effusion is noted with probable underlying atelectasis or infiltrate. Bony thorax is unremarkable. IMPRESSION: Mild left pleural effusion is noted with probable underlying atelectasis or infiltrate. Large left upper lobe mass is again noted consistent with history of lymphoma. Electronically Signed   By: Marijo Conception, M.D.   On: 04/16/2018 18:40    EKG:  No orders found for this or any previous visit.  ASSESSMENT AND PLAN:   Brianna Chung  is a 58 y.o. 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  1.  Early sepsis-white count is low due to recent chemotherapy, has predominant  neutrophilia -Has low-grade fever, tachypnea and tachycardia at the time  of admission -Chest x-ray with left lower lobe infiltrate. - blood cultures negative so far MRSA PCR negative, negative urine analysis.  Started on vancomycin and cefepime due to her recent hospitalization and and immunosuppressive therapy -Influenza panel negative and respiratory virus panel are pending  2.  Hypokalemia-being replaced.  Check potassium magnesium in a.m.  3.  Hypertension-on losartan and hydrochlorothiazide.  4.  Left upper extremity DVT-continue Eliquis.   5.  Large B-cell lymphoma-just started her first dose of chemotherapy with R-EPOCH. -Received last week.  On acyclovir for prophylaxis.  Which we will continue Outpatient follow-up with [facility] oncology as recommended Encourage ambulation.  Patient is independent at baseline     All the records are reviewed and case discussed with Care Management/Social Workerr. Management plans discussed with the patient, fianc at bedside and they are in agreement.  CODE STATUS: fc  TOTAL TIME TAKING CARE OF THIS PATIENT:  37 minutes.   POSSIBLE D/C IN 1-2 DAYS, DEPENDING ON CLINICAL CONDITION.  Note: This dictation was prepared with Dragon dictation along with smaller phrase technology. Any transcriptional errors that result from this process are unintentional.   Nicholes Mango M.D on 04/17/2018 at 12:33 PM  Between 7am to 6pm - Pager - [phone] After 6pm go to [email or web address] - password [email or web address] [facility]  [facility]  Office  [phone]  CC: Primary care physician; Center, [facility]

## 2018-04-18 LAB — BASIC METABOLIC PANEL
Anion gap: 6 (ref 5–15)
BUN: 14 mg/dL (ref 6–20)
CALCIUM: 8.2 mg/dL — AB (ref 8.9–10.3)
CO2: 29 mmol/L (ref 22–32)
Chloride: 99 mmol/L (ref 98–111)
Creatinine, Ser: 0.6 mg/dL (ref 0.44–1.00)
GFR calc Af Amer: >60 mL/min (ref >60)
GFR calc non Af Amer: >60 mL/min (ref >60)
Glucose, Bld: 101 mg/dL — ABNORMAL HIGH (ref 70–99)
Potassium: 2.9 mmol/L — ABNORMAL LOW (ref 3.5–5.1)
Sodium: 134 mmol/L — ABNORMAL LOW (ref 135–145)

## 2018-04-18 LAB — CBC WITH DIFFERENTIAL/PLATELET
Abs Immature Granulocytes: 0.07 10*3/uL (ref 0.00–0.07)
Basophils Absolute: 0 10*3/uL (ref 0.0–0.1)
Basophils Relative: 0 %
Eosinophils Absolute: 0.1 10*3/uL (ref 0.0–0.5)
Eosinophils Relative: 1 %
HCT: 28.9 % — ABNORMAL LOW (ref 36.0–46.0)
Hemoglobin: 9.7 g/dL — ABNORMAL LOW (ref 12.0–15.0)
Immature Granulocytes: 2 %
Lymphocytes Relative: 2 %
Lymphs Abs: 0.1 10*3/uL — ABNORMAL LOW (ref 0.7–4.0)
MCH: 28.7 pg (ref 26.0–34.0)
MCHC: 33.6 g/dL (ref 30.0–36.0)
MCV: 85.5 fL (ref 80.0–100.0)
Monocytes Absolute: 0 10*3/uL — ABNORMAL LOW (ref 0.1–1.0)
Monocytes Relative: 0 %
NEUTROS PCT: 95 %
Neutro Abs: 4.5 10*3/uL (ref 1.7–7.7)
Platelets: 195 10*3/uL (ref 150–400)
RBC: 3.38 MIL/uL — ABNORMAL LOW (ref 3.87–5.11)
RDW: 12.8 % (ref 11.5–15.5)
Smear Review: NORMAL
WBC: 4.7 10*3/uL (ref 4.0–10.5)
nRBC: 0 % (ref 0.0–0.2)

## 2018-04-18 LAB — POTASSIUM: Potassium: 3.4 mmol/L — ABNORMAL LOW (ref 3.5–5.1)

## 2018-04-18 LAB — MAGNESIUM: Magnesium: 2.1 mg/dL (ref 1.7–2.4)

## 2018-04-18 MED ORDER — POTASSIUM CHLORIDE CRYS ER 20 MEQ PO TBCR
40.0000 meq | EXTENDED_RELEASE_TABLET | Freq: Two times a day (BID) | ORAL | Status: DC
  Administered 2018-04-18: 40 meq via ORAL
  Filled 2018-04-18: qty 2

## 2018-04-18 MED ORDER — TBO-FILGRASTIM 480 MCG/0.8ML ~~LOC~~ SOSY
480.0000 ug | PREFILLED_SYRINGE | Freq: Every day | SUBCUTANEOUS | Status: DC
  Administered 2018-04-18 – 2018-04-20 (×3): 480 ug via SUBCUTANEOUS
  Filled 2018-04-18 (×3): qty 0.8

## 2018-04-18 MED ORDER — POTASSIUM CHLORIDE CRYS ER 20 MEQ PO TBCR
60.0000 meq | EXTENDED_RELEASE_TABLET | Freq: Once | ORAL | Status: AC
  Administered 2018-04-18: 23:00:00 60 meq via ORAL
  Filled 2018-04-18: qty 3

## 2018-04-18 MED ORDER — SODIUM CHLORIDE 0.9 % IV SOLN
INTRAVENOUS | Status: DC | PRN
  Administered 2018-04-18: 23:00:00 1000 mL via INTRAVENOUS
  Administered 2018-04-19: 22:00:00 via INTRAVENOUS
  Administered 2018-04-19: 1000 mL via INTRAVENOUS
  Administered 2018-04-19 – 2018-04-20 (×2): 500 mL via INTRAVENOUS

## 2018-04-18 MED ORDER — POTASSIUM CHLORIDE CRYS ER 20 MEQ PO TBCR
40.0000 meq | EXTENDED_RELEASE_TABLET | Freq: Once | ORAL | Status: AC
  Administered 2018-04-18: 40 meq via ORAL
  Filled 2018-04-18: qty 2

## 2018-04-18 NOTE — Consult Note (Signed)
PHARMACY CONSULT NOTE - FOLLOW UP  Pharmacy Consult for Electrolyte Monitoring and Replacement   Recent Labs: Potassium (mmol/L)  Date Value  04/18/2018 2.9 (L)   Magnesium (mg/dL)  Date Value  04/18/2018 2.1   Calcium (mg/dL)  Date Value  04/18/2018 8.2 (L)   Sodium (mmol/L)  Date Value  04/18/2018 134 (L)     Assessment: Patient with low K level.  Patient previously receiving KCl PO 20 mEq BID then increased to KCl 40 mEq BID.  While taking KCl 20 mEq BID patient K level stay relatively stable at ~3.0.  Goal of Therapy:  K ~ 4.0 Mg ~ 2.0  Plan:  Mg - 2.1 and WNL  K - 2.9 at 0541.  Patient has received PO KCl 40 mEq once this morning.  Will order additional PO KCl 40 mEq once now and will draw follow up lab at 2100.  Forrest Moron ,PharmD Clinical Pharmacist 04/18/2018 3:31 PM

## 2018-04-18 NOTE — Progress Notes (Signed)
[facility] NAME: Brianna Chung    MR#:  [id number]  DATE OF BIRTH:  1960/09/06  SUBJECTIVE:  CHIEF COMPLAINT: Patient is feeling okay denies any shortness of breath but she had 101.2 temp last night.  Fianc at bedside  REVIEW OF SYSTEMS:  CONSTITUTIONAL: No fever, fatigue or weakness.  EYES: No blurred or double vision.  EARS, NOSE, AND THROAT: No tinnitus or ear pain.  RESPIRATORY: No cough, shortness of breath, wheezing or hemoptysis.  CARDIOVASCULAR: No chest pain, orthopnea, edema.  GASTROINTESTINAL: No nausea, vomiting, diarrhea or abdominal pain.  GENITOURINARY: No dysuria, hematuria.  ENDOCRINE: No polyuria, nocturia,  HEMATOLOGY: No anemia, easy bruising or bleeding SKIN: No rash or lesion. MUSCULOSKELETAL: No joint pain or arthritis.   NEUROLOGIC: No tingling, numbness, weakness.  PSYCHIATRY: No anxiety or depression.   DRUG ALLERGIES:  No Known Allergies  VITALS:  Blood pressure 123/67, pulse 98, temperature 99.3 F (37.4 C), temperature source Oral, resp. rate 16, height 5\' 5"  (1.651 m), weight 90.6 kg, last menstrual period 03/16/2018, SpO2 95 %.  PHYSICAL EXAMINATION:  GENERAL:  58 y.o.-year-old patient lying in the bed with no acute distress.  EYES: Pupils equal, round, reactive to light and accommodation. No scleral icterus. Extraocular muscles intact.  HEENT: Head atraumatic, normocephalic. Oropharynx and nasopharynx clear.  NECK:  Supple, no jugular venous distention. No thyroid enlargement, no tenderness.  LUNGS: Normal breath sounds bilaterally, no wheezing, rales,rhonchi or crepitation. No use of accessory muscles of respiration.  CARDIOVASCULAR: S1, S2 normal. No murmurs, rubs, or gallops.  ABDOMEN: Soft, nontender, nondistended. Bowel sounds present.  EXTREMITIES: No pedal edema, cyanosis, or clubbing.  NEUROLOGIC: Awake, alert and oriented x3 sensation intact. Gait not checked.  PSYCHIATRIC:  The patient is alert and oriented x 3.  SKIN: No obvious rash, lesion, or ulcer.    LABORATORY PANEL:   CBC Recent Labs  Lab 04/18/18 0541  WBC 4.7  HGB 9.7*  HCT 28.9*  PLT 195   ------------------------------------------------------------------------------------------------------------------  Chemistries  Recent Labs  Lab 04/18/18 0541  NA 134*  K 2.9*  CL 99  CO2 29  GLUCOSE 101*  BUN 14  CREATININE 0.60  CALCIUM 8.2*  MG 2.1   ------------------------------------------------------------------------------------------------------------------  Cardiac Enzymes No results for input(s): TROPONINI in the last 168 hours. ------------------------------------------------------------------------------------------------------------------  RADIOLOGY:  Dg Chest 2 View  Result Date: 04/16/2018 CLINICAL DATA:  Fever, history of lymphoma. EXAM: CHEST - 2 VIEW COMPARISON:  CT scan of April 01, 2018. FINDINGS: Mild cardiomegaly is noted. Right internal jugular Port-A-Cath is unchanged in position. Large left upper lobe mass is again noted consistent with history of lymphoma. Right lung is clear. Mild left pleural effusion is noted with probable underlying atelectasis or infiltrate. Bony thorax is unremarkable. IMPRESSION: Mild left pleural effusion is noted with probable underlying atelectasis or infiltrate. Large left upper lobe mass is again noted consistent with history of lymphoma. Electronically Signed   By: Marijo Conception, M.D.   On: 04/16/2018 18:40    EKG:  No orders found for this or any previous visit.  ASSESSMENT AND PLAN:   Brianna Chung  is a 58 y.o. 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  1.  Early sepsis-white count is low due to recent chemotherapy, has predominant neutrophilia -Has low-grade fever, tachypnea  and tachycardia at the  time of admission Febrile last night -Chest x-ray with left lower lobe infiltrate. - blood cultures negative so far MRSA PCR negative, negative urine analysis.  Started on vancomycin and cefepime due to her recent hospitalization and and immunosuppressive therapy.  DC'd with vancomycin and continue cefepime -Influenza panel negative and respiratory virus panel are negative  2.  Hypokalemia-being replaced.  Check potassium magnesium in a.m. Magnesium at 2.1 potassium at 2.9 we will replete potassium and check in a.m.  3.  Hypertension-on losartan and hydrochlorothiazide.  4.  Left upper extremity DVT-continue Eliquis.   5.  Large B-cell lymphoma-just started her first dose of chemotherapy with R-EPOCH. -Received last week.  On acyclovir for prophylaxis.  Which we will continue Outpatient follow-up with [facility] as recommended Discussed with [facility] NP [name] at [phone].  Recommending Neupogen 5 mcg/kg once daily subcutaneously for 7 days Starting first dose today discussed with pharmacist.  Patient does not feel comfortable to get discharged subcutaneously by herself.  We will try to arrange home health RN if possible Encourage ambulation.  Patient is independent at baseline     All the records are reviewed and case discussed with Care Management/Social Workerr. Management plans discussed with the patient, fianc at bedside and they are in agreement.  CODE STATUS: fc  TOTAL TIME TAKING CARE OF THIS PATIENT:  37 minutes.   POSSIBLE D/C IN 1-2 DAYS, DEPENDING ON CLINICAL CONDITION.  Note: This dictation was prepared with Dragon dictation along with smaller phrase technology. Any transcriptional errors that result from this process are unintentional.   Nicholes Mango M.D on 04/18/2018 at 3:22 PM  Between 7am to 6pm - Pager - [phone] After 6pm go to [email or web address] - password [email or web address] [facility]  [facility]  Office   [phone]  CC: Primary care physician; Center, [facility]

## 2018-04-18 NOTE — Consult Note (Signed)
PHARMACY CONSULT NOTE - FOLLOW UP  Pharmacy Consult for Electrolyte Monitoring and Replacement   Recent Labs: Potassium (mmol/L)  Date Value  04/18/2018 3.4 (L)   Magnesium (mg/dL)  Date Value  04/18/2018 2.1   Calcium (mg/dL)  Date Value  04/18/2018 8.2 (L)   Sodium (mmol/L)  Date Value  04/18/2018 134 (L)     Assessment: Patient with low K level.  Patient previously receiving KCl PO 20 mEq BID then increased to KCl 40 mEq BID.  While taking KCl 20 mEq BID patient K level stay relatively stable at ~3.0.  Goal of Therapy:  K ~ 4.0 Mg ~ 2.0  Plan:  02/10 @ 2100 K 3.4 will replace w/ KCI 60 mEq PO x 1 and will recheck w/ am labs. Mg WNL.  Tobie Lords ,PharmD Clinical Pharmacist 04/18/2018 10:01 PM

## 2018-04-18 NOTE — Care Management Note (Addendum)
Case Management Note  Patient Details  Name: Brianna Chung MRN: [id number] Date of Birth: Jul 13, 1960  Subjective/Objective: Patient is admitted with pneumonia, plan was for patient to discharge today but patient spiked a fever greater than 101 last night and Dr. Berline Lopes is not comfortable discharging the patient unless she has been fever free for 24 hours.  Patient was scheduled for Oncology appointment today at [facility] to have Neulasta given.  The Neulasta must be administered within 72 hours of treatment.  Pharmacist is unable to order the Neulasta for inpatient it is for outpatient use only.  Plan will have to be for patient to receive Nupogen once daily for 7 days.  [facility] is working on Estate agent for outpatient use, [facility] will give dose 1 today and can given tomorrow dose but [facility] is unable to provide medication for outpatient administration by patient.    Per NP at [facility] oncology clinic prior authorization is required and the copay's are usually very expensive for the Nupogen.  Matt the RN at [facility] clinic has RNCM number and can contact if needed for additional assistance.  Doran Clay RN BSN   [phone]                Action/Plan:   Expected Discharge Date:                  Expected Discharge Plan:  Home/Self Care  In-House Referral:     Discharge planning Services  CM Consult, Other - See comment(Nulesta/ Nupogen)  Post Acute Care Choice:    Choice offered to:     DME Arranged:    DME Agency:     HH Arranged:    HH Agency:     Status of Service:  In process, will continue to follow  If discussed at Long Length of Stay Meetings, dates discussed:    Additional Comments:  Shelbie Hutching, RN 04/18/2018, 1:53 PM

## 2018-04-19 ENCOUNTER — Inpatient Hospital Stay: Payer: BLUE CROSS/BLUE SHIELD

## 2018-04-19 DIAGNOSIS — C8520 Mediastinal (thymic) large B-cell lymphoma, unspecified site: Secondary | ICD-10-CM

## 2018-04-19 DIAGNOSIS — Z86718 Personal history of other venous thrombosis and embolism: Secondary | ICD-10-CM

## 2018-04-19 DIAGNOSIS — Z7901 Long term (current) use of anticoagulants: Secondary | ICD-10-CM

## 2018-04-19 DIAGNOSIS — Z95828 Presence of other vascular implants and grafts: Secondary | ICD-10-CM

## 2018-04-19 DIAGNOSIS — I10 Essential (primary) hypertension: Secondary | ICD-10-CM

## 2018-04-19 DIAGNOSIS — R5383 Other fatigue: Secondary | ICD-10-CM

## 2018-04-19 DIAGNOSIS — R509 Fever, unspecified: Secondary | ICD-10-CM

## 2018-04-19 LAB — CBC
HCT: 30.6 % — ABNORMAL LOW (ref 36.0–46.0)
Hemoglobin: 9.9 g/dL — ABNORMAL LOW (ref 12.0–15.0)
MCH: 27.3 pg (ref 26.0–34.0)
MCHC: 32.4 g/dL (ref 30.0–36.0)
MCV: 84.5 fL (ref 80.0–100.0)
NRBC: 0 % (ref 0.0–0.2)
Platelets: 167 10*3/uL (ref 150–400)
RBC: 3.62 MIL/uL — ABNORMAL LOW (ref 3.87–5.11)
RDW: 12.7 % (ref 11.5–15.5)
WBC: 7.1 10*3/uL (ref 4.0–10.5)

## 2018-04-19 LAB — BASIC METABOLIC PANEL
Anion gap: 6 (ref 5–15)
BUN: 10 mg/dL (ref 6–20)
CO2: 27 mmol/L (ref 22–32)
Calcium: 8.5 mg/dL — ABNORMAL LOW (ref 8.9–10.3)
Chloride: 102 mmol/L (ref 98–111)
Creatinine, Ser: 0.58 mg/dL (ref 0.44–1.00)
GFR calc Af Amer: >60 mL/min (ref >60)
GFR calc non Af Amer: >60 mL/min (ref >60)
Glucose, Bld: 109 mg/dL — ABNORMAL HIGH (ref 70–99)
POTASSIUM: 3.8 mmol/L (ref 3.5–5.1)
Sodium: 135 mmol/L (ref 135–145)

## 2018-04-19 MED ORDER — GUAIFENESIN-DM 100-10 MG/5ML PO SYRP
5.0000 mL | ORAL_SOLUTION | ORAL | Status: DC | PRN
  Administered 2018-04-20: 01:00:00 5 mL via ORAL
  Filled 2018-04-19: qty 5

## 2018-04-19 MED ORDER — POTASSIUM CHLORIDE CRYS ER 20 MEQ PO TBCR
40.0000 meq | EXTENDED_RELEASE_TABLET | Freq: Three times a day (TID) | ORAL | Status: DC
  Administered 2018-04-19 – 2018-04-20 (×4): 40 meq via ORAL
  Filled 2018-04-19 (×4): qty 2

## 2018-04-19 NOTE — Consult Note (Signed)
PHARMACY CONSULT NOTE - FOLLOW UP  Pharmacy Consult for Electrolyte Monitoring and Replacement   Recent Labs: Potassium (mmol/L)  Date Value  04/19/2018 3.8   Magnesium (mg/dL)  Date Value  04/18/2018 2.1   Calcium (mg/dL)  Date Value  04/19/2018 8.5 (L)   Sodium (mmol/L)  Date Value  04/19/2018 135     Assessment: Patient with low K level.  Patient previously receiving KCl PO 20 mEq BID then increased to KCl 40 mEq BID.  While taking KCl 20 mEq BID patient K level stay relatively stable at ~3.0. Patient received an extra 23mEq PO on 2/10 for a total of 100 mEq PO.  Goal of Therapy:  K ~ 4.0 Mg ~ 2.0  Plan:  02/11 K 3.8 will increase scheduled KCl supplements to 12mEq PO tid. Recheck BMET and Mag with AM labs.  Paulina Fusi, PharmD, BCPS 04/19/2018 9:03 AM

## 2018-04-19 NOTE — Consult Note (Signed)
NAME: Brianna Chung  DOB: [birth year]  MRN: [id number]  Date/Time: 04/19/2018 1:43 PM  REQUESTING PROVIDER Gouru Subjective:  REASON FOR CONSULT: fever ? Brianna Chung is a 58 y.o. 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 Medical records from [place] reviewed.  She was recently admitted to [place] between 04/08/2018 until 2/7/202 for  chemotherapy.  In November 2019 she presented with cough and fever and a chest x-ray showed ill-defined opacity and a CT scan showed a large left upper lobe lung mass.  She underwent endobronchial biopsy which identified a clonal population of medium sized to large lymphoid cells.  A PET CT scan was done which showed the left upper lobe mass extending into the left anterior mediastinum aortic arch, ascending aorta and pulmonary trunk with intense FDG avid detail.  A bone marrow biopsy was notable for normocellular marrow without evidence of lymphoma.  She underwent left mediastinotomy with biopsy of the mass diagnostic of DLBCL activated B-cell subtype.  On 04/01/2018 she was diagnosed with left upper extremity DVT and started on Eliquis.   She was admitted to the hospital on 04/08/2018 and had a dual dual-lumen PowerPort placed on 04/10/2018.  Started on chemotherapy.  And after day 6 of chemotherapy she was discharged home on 04/15/2018. That evening she felt tired and the next day as her temp was 100.8 she was brought to the ED. Pt says she is feeling better. Does not feel like she has a fever, has a baseline cough, no sputum Diarrhea secondary to miralax given in the hospital. Appetite good, no rash   Past Surgical History:  Procedure Laterality Date  . biospy     lung mass and mediastinal mass  . PORTACATH PLACEMENT     SH Non smoker Occasional alcohol No illicit drug use  Family History  Problem Relation Age of Onset  . Hypertension Mother     . Hypertension Father   . Lung cancer Brother    No Known Allergies  ? Current Facility-Administered Medications  Medication Dose Route Frequency Provider Last Rate Last Dose  . 0.9 %  sodium chloride infusion   Intravenous PRN Nicholes Mango, MD 10 mL/hr at 04/19/18 0619 1,000 mL at 04/19/18 0619  . acetaminophen (TYLENOL) tablet 650 mg  650 mg Oral Q6H PRN Gladstone Lighter, MD   650 mg at 04/18/18 2022   Or  . acetaminophen (TYLENOL) suppository 650 mg  650 mg Rectal Q6H PRN Gladstone Lighter, MD      . acyclovir (ZOVIRAX) 200 MG capsule 200 mg  200 mg Oral BID Gladstone Lighter, MD   200 mg at 04/19/18 1100  . apixaban (ELIQUIS) tablet 5 mg  5 mg Oral BID Gladstone Lighter, MD   5 mg at 04/19/18 1100  . ceFEPIme (MAXIPIME) 2 g in sodium chloride 0.9 % 100 mL IVPB  2 g Intravenous Q8H Oswald Hillock, RPH 200 mL/hr at 04/19/18 0620 2 g at 04/19/18 4098  . hydrochlorothiazide (HYDRODIURIL) tablet 12.5 mg  12.5 mg Oral Daily Gladstone Lighter, MD   12.5 mg at 04/19/18 1100  . loratadine (CLARITIN) tablet 10 mg  10 mg Oral Daily PRN Gladstone Lighter, MD      . losartan (COZAAR) tablet 100 mg  100 mg Oral Daily Gladstone Lighter, MD   100 mg at 04/19/18 1100  . ondansetron (ZOFRAN) tablet 4 mg  4 mg Oral Q6H PRN  Gladstone Lighter, MD       Or  . ondansetron [facility]) injection 4 mg  4 mg Intravenous Q6H PRN Gladstone Lighter, MD      . polyethylene glycol (MIRALAX / GLYCOLAX) packet 17 g  17 g Oral Daily Gladstone Lighter, MD   17 g at 04/18/18 0921  . potassium chloride SA (K-DUR,KLOR-CON) CR tablet 40 mEq  40 mEq Oral TID Vira Blanco, RPH   40 mEq at 04/19/18 1100  . senna-docusate (Senokot-S) tablet 2 tablet  2 tablet Oral BID Gladstone Lighter, MD   2 tablet at 04/18/18 1517  . sodium chloride flush (NS) 0.9 % injection 10-40 mL  10-40 mL Intracatheter PRN Gladstone Lighter, MD      . Tbo-Filgrastim [facility]) injection 480 mcg  480 mcg Subcutaneous Daily Gouru, Aruna, MD    480 mcg at 04/19/18 1100  . Vitamin D (Ergocalciferol) (DRISDOL) capsule 50,000 Units  50,000 Units Oral Q Loura Pardon, Hart Rochester, MD   50,000 Units at 04/17/18 1432     Abtx:  Anti-infectives (From admission, onward)   Start     Dose/Rate Route Frequency Ordered Stop   04/17/18 0900  vancomycin (VANCOCIN) 1,250 mg in sodium chloride 0.9 % 250 mL IVPB  Status:  Discontinued     1,250 mg 166.7 mL/hr over 90 Minutes Intravenous Every 12 hours 04/16/18 2140 04/18/18 1036   04/17/18 0600  ceFEPIme (MAXIPIME) 2 g in sodium chloride 0.9 % 100 mL IVPB     2 g 200 mL/hr over 30 Minutes Intravenous Every 8 hours 04/16/18 2140     04/16/18 2200  acyclovir (ZOVIRAX) 200 MG capsule 200 mg     200 mg Oral 2 times daily 04/16/18 2148     04/16/18 2100  vancomycin (VANCOCIN) 2,000 mg in sodium chloride 0.9 % 500 mL IVPB     2,000 mg 250 mL/hr over 120 Minutes Intravenous  Once 04/16/18 2045 04/17/18 0104   04/16/18 1915  vancomycin (VANCOCIN) IVPB 1000 mg/200 mL premix  Status:  Discontinued     1,000 mg 200 mL/hr over 60 Minutes Intravenous  Once 04/16/18 1912 04/16/18 2044   04/16/18 1915  ceFEPIme (MAXIPIME) 2 g in sodium chloride 0.9 % 100 mL IVPB     2 g 200 mL/hr over 30 Minutes Intravenous  Once 04/16/18 1912 04/16/18 2110      REVIEW OF SYSTEMS:  Const:  fever, negative chills, negative weight loss Eyes: negative diplopia or visual changes, negative eye pain ENT: negative coryza, negative sore throat Resp: baseline cough, hemoptysis, dyspnea Cards: negative for chest pain, palpitations, lower extremity edema GU: negative for frequency, dysuria and hematuria GI: Negative for abdominal pain,  bleeding, constipation Skin: negative for rash and pruritus Heme: negative for easy bruising and gum/nose bleeding MS: negative for myalgias, arthralgias, back pain has muscle weakness Neurolo:negative for headaches, dizziness, vertigo, memory problems  Psych: negative for feelings of anxiety,  depression  Endocrine: negative for thyroid, diabetes Allergy/Immunology- negative for any medication or food allergies ? Objective:  VITALS:  BP 113/68 (BP Location: Right Arm)   Pulse 95   Temp 98.5 F (36.9 C) (Oral)   Resp 19   Ht '5\' 5"'  (1.651 m)   Wt 90.6 kg   LMP 03/16/2018   SpO2 96%   BMI 33.25 kg/m  PHYSICAL EXAM:  General: Alert, cooperative, no distress, appears stated age. Does not look septic Head: Normocephalic, without obvious abnormality, atraumatic. Eyes: Conjunctivae clear, anicteric sclerae. Pupils are equal ENT  Nares normal. No drainage or sinus tenderness. Lips, mucosa, and tongue normal. No Thrush Neck: Supple, symmetrical, no adenopathy, thyroid: non tender no carotid bruit and no JVD. Back: No CVA tenderness. Lungs: b/l air entry- decreased left base, crepts present Heart: Regular rate and rhythm, no murmur, rub or gallop. Rt port site covered with dressing- has scab over the site and over the neck  Abdomen: Soft, non-tender,not distended. Bowel sounds normal. No masses Extremities: atraumatic, no cyanosis. No edema. No clubbing Skin: No rashes or lesions. Or bruising Lymph: Cervical, supraclavicular normal. Neurologic: Grossly non-focal Pertinent Labs Lab Results CBC    Component Value Date/Time   WBC 7.1 04/19/2018 0621   RBC 3.62 (L) 04/19/2018 0621   HGB 9.9 (L) 04/19/2018 0621   HCT 30.6 (L) 04/19/2018 0621   PLT 167 04/19/2018 0621   MCV 84.5 04/19/2018 0621   MCH 27.3 04/19/2018 0621   MCHC 32.4 04/19/2018 0621   RDW 12.7 04/19/2018 0621   LYMPHSABS 0.1 (L) 04/18/2018 0541   MONOABS 0.0 (L) 04/18/2018 0541   EOSABS 0.1 04/18/2018 0541   BASOSABS 0.0 04/18/2018 0541    CMP Latest Ref Rng & Units 04/19/2018 04/18/2018 04/18/2018  Glucose 70 - 99 mg/dL 109(H) - 101(H)  BUN 6 - 20 mg/dL 10 - 14  Creatinine 0.44 - 1.00 mg/dL 0.58 - 0.60  Sodium 135 - 145 mmol/L 135 - 134(L)  Potassium 3.5 - 5.1 mmol/L 3.8 3.4(L) 2.9(L)  Chloride 98 -  111 mmol/L 102 - 99  CO2 22 - 32 mmol/L 27 - 29  Calcium 8.9 - 10.3 mg/dL 8.5(L) - 8.2(L)      Microbiology: Recent Results (from the past 240 hour(s))  Blood culture (routine x 2)     Status: None (Preliminary result)   Collection Time: 04/16/18  4:17 PM  Result Value Ref Range Status   Specimen Description BLOOD RIGHT ANTECUBITAL  Final   Special Requests   Final    BOTTLES DRAWN AEROBIC AND ANAEROBIC Blood Culture results may not be optimal due to an excessive volume of blood received in culture bottles   Culture   Final    NO GROWTH 3 DAYS Performed at [facility], [address], [place], [place] [address]    Report Status PENDING  Incomplete  Blood culture (routine x 2)     Status: None (Preliminary result)   Collection Time: 04/16/18  9:56 PM  Result Value Ref Range Status   Specimen Description BLOOD RIGHT HAND  Final   Special Requests   Final    IN PEDIATRIC BOTTLE Blood Culture results may not be optimal due to an inadequate volume of blood received in culture bottles   Culture   Final    NO GROWTH 3 DAYS Performed at [facility], [address] [place], [place] [address]    Report Status PENDING  Incomplete  Respiratory Panel by PCR     Status: None   Collection Time: 04/16/18 10:35 PM  Result Value Ref Range Status   Adenovirus NOT DETECTED NOT DETECTED Final   Coronavirus 229E NOT DETECTED NOT DETECTED Final    Comment: (NOTE) The Coronavirus on the Respiratory Panel, DOES NOT test for the novel  Coronavirus (2019 nCoV)    Coronavirus HKU1 NOT DETECTED NOT DETECTED Final   Coronavirus NL63 NOT DETECTED NOT DETECTED Final   Coronavirus OC43 NOT DETECTED NOT DETECTED Final   Metapneumovirus NOT DETECTED NOT DETECTED Final   Rhinovirus / Enterovirus NOT DETECTED NOT  DETECTED Final   Influenza A NOT DETECTED NOT DETECTED Final   Influenza B NOT DETECTED NOT DETECTED Final   Parainfluenza Virus 1 NOT DETECTED NOT DETECTED Final    Parainfluenza Virus 2 NOT DETECTED NOT DETECTED Final   Parainfluenza Virus 3 NOT DETECTED NOT DETECTED Final   Parainfluenza Virus 4 NOT DETECTED NOT DETECTED Final   Respiratory Syncytial Virus NOT DETECTED NOT DETECTED Final   Bordetella pertussis NOT DETECTED NOT DETECTED Final   Chlamydophila pneumoniae NOT DETECTED NOT DETECTED Final   Mycoplasma pneumoniae NOT DETECTED NOT DETECTED Final  MRSA PCR Screening     Status: None   Collection Time: 04/16/18 10:35 PM  Result Value Ref Range Status   MRSA by PCR NEGATIVE NEGATIVE Final    Comment:        The GeneXpert MRSA Assay (FDA approved for NASAL specimens only), is one component of a comprehensive MRSA colonization surveillance program. It is not intended to diagnose MRSA infection nor to guide or monitor treatment for MRSA infections. Performed at [facility], [address] [place], [place] [address]     IMAGING RESULTS:      I have personally reviewed the films Large LEFT upper lobe/AP window mass, with extensive mediastinal involvement by CT. LEFT lower lobe consolidation consistent with pneumonia.Associated LEFT pleural effusion. ? Impression/Recommendation ?58 y.o. 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 S/p chemo between 04/10/18-04/15/18  Fever in a patient with mediastinal large B-cell lymphoma status post first cycle of chemotherapy. The fever could  be secondary to the malignancy itself. She had received recently steroids during chemotherapy and this could be a post steroid effect as well. I doubt she has  left lower lobe pneumonia as reported on the chest x-ray, as in a CT scan in Jan 2020 at [facility] there was a Small left pleural effusion and  Left basilar atelectasis seen.       She is not neutropenic and hence concern for fungal or unusual organisms currently not an  issue. She is currently on cefepime (day 3) which can be changed to Levaquin or doxycycline for 2 more days.  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  Followed at [facility]. Also received G-CSF.  DVT-left arm :Occlusive thrombus in the left internal jugular vein with extension into all the left brachiocephalic, left subclavian, left axillary vein due to the malignancy- on eliquis  ___________________________________________________ Discussed with patient in detail Note:  This document was prepared using Dragon voice recognition software and may include unintentional dictation errors.

## 2018-04-19 NOTE — Plan of Care (Signed)
  Problem: Education: Goal: Knowledge of the prescribed therapeutic regimen will improve Outcome: Progressing   Problem: Activity: Goal: Ability to implement measures to reduce episodes of fatigue will improve Outcome: Progressing   Problem: Bowel/Gastric: Goal: Will not experience complications related to bowel motility Outcome: Progressing   Problem: Coping: Goal: Ability to identify and develop effective coping behavior will improve Outcome: Progressing   Problem: Nutritional: Goal: Maintenance of adequate nutrition will improve Outcome: Progressing   Problem: Education: Goal: Knowledge of General Education information will improve Description Including pain rating scale, medication(s)/side effects and non-pharmacologic comfort measures Outcome: Progressing   Problem: Health Behavior/Discharge Planning: Goal: Ability to manage health-related needs will improve Outcome: Progressing   Problem: Clinical Measurements: Goal: Ability to maintain clinical measurements within normal limits will improve Outcome: Progressing Goal: Will remain free from infection Outcome: Progressing Goal: Diagnostic test results will improve Outcome: Progressing Goal: Respiratory complications will improve Outcome: Progressing Goal: Cardiovascular complication will be avoided Outcome: Progressing   Problem: Activity: Goal: Risk for activity intolerance will decrease Outcome: Progressing   Problem: Nutrition: Goal: Adequate nutrition will be maintained Outcome: Progressing   Problem: Coping: Goal: Level of anxiety will decrease Outcome: Progressing   Problem: Elimination: Goal: Will not experience complications related to bowel motility Outcome: Progressing Goal: Will not experience complications related to urinary retention Outcome: Progressing   Problem: Pain Managment: Goal: General experience of comfort will improve Outcome: Progressing   Problem: Safety: Goal: Ability to  remain free from injury will improve Outcome: Progressing   Problem: Safety: Goal: Ability to remain free from injury will improve Outcome: Progressing   Problem: Skin Integrity: Goal: Risk for impaired skin integrity will decrease Outcome: Progressing

## 2018-04-19 NOTE — Progress Notes (Signed)
[place] [facility] [place] NAME: Brianna Chung    MR#:  [id number]  DATE OF BIRTH:  12/16/1960  SUBJECTIVE:  CHIEF COMPLAINT: Patient was febrile is feeling okay denies any shortness of breath but she had high gr temp again last night.  Fianc at bedside  REVIEW OF SYSTEMS:  CONSTITUTIONAL: No fever, fatigue or weakness.  EYES: No blurred or double vision.  EARS, NOSE, AND THROAT: No tinnitus or ear pain.  RESPIRATORY: No cough, shortness of breath, wheezing or hemoptysis.  CARDIOVASCULAR: No chest pain, orthopnea, edema.  GASTROINTESTINAL: No nausea, vomiting, diarrhea or abdominal pain.  GENITOURINARY: No dysuria, hematuria.  ENDOCRINE: No polyuria, nocturia,  HEMATOLOGY: No anemia, easy bruising or bleeding SKIN: No rash or lesion. MUSCULOSKELETAL: No joint pain or arthritis.   NEUROLOGIC: No tingling, numbness, weakness.  PSYCHIATRY: No anxiety or depression.   DRUG ALLERGIES:  No Known Allergies  VITALS:  Blood pressure 113/68, pulse 95, temperature 98.5 F (36.9 C), temperature source Oral, resp. rate 19, height 5\' 5"  (1.651 m), weight 90.6 kg, last menstrual period 03/16/2018, SpO2 96 %.  PHYSICAL EXAMINATION:  GENERAL:  58 y.o.-year-old patient lying in the bed with no acute distress.  EYES: Pupils equal, round, reactive to light and accommodation. No scleral icterus. Extraocular muscles intact.  HEENT: Head atraumatic, normocephalic. Oropharynx and nasopharynx clear.  NECK:  Supple, no jugular venous distention. No thyroid enlargement, no tenderness.  LUNGS: Normal breath sounds bilaterally, no wheezing, rales,rhonchi or crepitation. No use of accessory muscles of respiration.  CARDIOVASCULAR: S1, S2 normal. No murmurs, rubs, or gallops.  ABDOMEN: Soft, nontender, nondistended. Bowel sounds present.  EXTREMITIES: No pedal edema, cyanosis, or clubbing.  NEUROLOGIC: Awake, alert and oriented x3 sensation intact. Gait not  checked.  PSYCHIATRIC: The patient is alert and oriented x 3.  SKIN: No obvious rash, lesion, or ulcer.    LABORATORY PANEL:   CBC Recent Labs  Lab 04/19/18 0621  WBC 7.1  HGB 9.9*  HCT 30.6*  PLT 167   ------------------------------------------------------------------------------------------------------------------  Chemistries  Recent Labs  Lab 04/18/18 0541  04/19/18 0621  NA 134*  --  135  K 2.9*   < > 3.8  CL 99  --  102  CO2 29  --  27  GLUCOSE 101*  --  109*  BUN 14  --  10  CREATININE 0.60  --  0.58  CALCIUM 8.2*  --  8.5*  MG 2.1  --   --    < > = values in this interval not displayed.   ------------------------------------------------------------------------------------------------------------------  Cardiac Enzymes No results for input(s): TROPONINI in the last 168 hours. ------------------------------------------------------------------------------------------------------------------  RADIOLOGY:  Dg Chest 2 View  Result Date: 04/19/2018 CLINICAL DATA:  Increasing shortness of breath, fever, history lymphoma, hypertension EXAM: CHEST - 2 VIEW COMPARISON:  04/16/2018 Correlation: CT chest 04/01/2018 FINDINGS: RIGHT jugular Port-A-Cath with tip projecting over SVC. Enlargement of cardiac silhouette. Large LEFT upper lobe/AP window mass and suspected hilar adenopathy again identified, mediastinal involvement less evident by chest radiograph than on prior CT. Consolidation of LEFT lower lobe with associated small LEFT pleural effusion. RIGHT lung clear. No pneumothorax or acute osseous findings. IMPRESSION: Large LEFT upper lobe/AP window mass, with extensive mediastinal involvement by CT. LEFT lower lobe consolidation consistent with pneumonia. Associated LEFT pleural effusion. Electronically Signed   By: Lavonia Dana M.D.   On: 04/19/2018 09:24    EKG:  No orders found for this or any previous  visit.  ASSESSMENT AND PLAN:   Brianna Chung  is a 58 y.o. 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  1. sepsis-white count is low due to recent chemotherapy, has predominant neutrophilia -Has low-grade fever, tachypnea and tachycardia at the time of admission Febrile last night -Chest x-ray with left lower lobe infiltrate with a left-sided pleural effusion but patient is asymptomatic - blood cultures negative so far MRSA PCR negative, negative urine analysis.  Started on vancomycin and cefepime due to her recent hospitalization and and immunosuppressive therapy.  DC'd with vancomycin and continue cefepime -Influenza panel negative and respiratory virus panel are negative -ID consult placed for persistent fever.  Will get repeat blood cultures if patient spikes fever again  2.  Hypokalemia-being replaced.  Check potassium magnesium in a.m. Magnesium at 2.1 potassium at 2.9 we will replete potassium and check in a.m.  3.  Hypertension-on losartan and hydrochlorothiazide.  4.  Left upper extremity DVT-continue Eliquis.   5.  Large left upper lobe mass with extensive involvement of the mediastinum-we will discuss with [facility]-primary oncology    Large B-cell lymphoma-just started her first dose of chemotherapy with R-EPOCH. -Received last week.  On acyclovir for prophylaxis.  Which we will continue Outpatient follow-up with [facility] oncology as recommended Discussed with [facility] oncology NP [name] at [phone].  Recommending Neupogen 5 mcg/kg once daily subcutaneously for 7 days Started first dose 04/18/18  discussed with pharmacist.  Patient does not feel comfortable to get discharged subcutaneously by herself.  We will try to arrange home health RN if possible Encourage ambulation.  Patient is independent at baseline     All the records are reviewed and case discussed with Care Management/Social  Workerr. Management plans discussed with the patient, fianc at bedside and they are in agreement.  CODE STATUS: fc  TOTAL TIME TAKING CARE OF THIS PATIENT:  37 minutes.   POSSIBLE D/C IN 1-2 DAYS, DEPENDING ON CLINICAL CONDITION.  Note: This dictation was prepared with Dragon dictation along with smaller phrase technology. Any transcriptional errors that result from this process are unintentional.   Nicholes Mango M.D on 04/19/2018 at 3:11 PM  Between 7am to 6pm - Pager - [phone] After 6pm go to [email or web address] - password [email or web address] [facility]  [facility]  Office  [phone]  CC: Primary care physician; Center, [facility]

## 2018-04-19 NOTE — Care Management Note (Signed)
Case Management Note  Patient Details  Name: Noemi Bellissimo MRN: [id number] Date of Birth: 17-Sep-1960  Subjective/Objective:    Patient will not discharge home today patient spike a fever again last night and needs to be discharge free for 24 hours.  Neupogen started yesterday here at [facility], patient needs one injection every day for 7 days without missing any doses.  [facility] is arranging outpatient medication for patient.  Nuelesta could not be started in the hospital and had to be administered within 72 hours of last treatment.  [facility] clinic NP instructed me that the prescription had to be sent to [place] somewhere to be filled and they will ship it here.  RNCM will cont to follow until discharge.  Patient will receive dose 3 tomorrow but if discharged MD and RNCM need to follow up with [facility]- main number- [phone].    Doran Clay RN BSN [phone]                 Action/Plan:   Expected Discharge Date:                  Expected Discharge Plan:  Home/Self Care  In-House Referral:     Discharge planning Services  CM Consult, Other - See comment(Nulesta/ Nupogen)  Post Acute Care Choice:    Choice offered to:     DME Arranged:    DME Agency:     HH Arranged:    HH Agency:     Status of Service:  In process, will continue to follow  If discussed at Long Length of Stay Meetings, dates discussed:    Additional Comments:  Shelbie Hutching, RN 04/19/2018, 3:05 PM

## 2018-04-20 LAB — MAGNESIUM: MAGNESIUM: 2.1 mg/dL (ref 1.7–2.4)

## 2018-04-20 LAB — CBC WITH DIFFERENTIAL/PLATELET
Abs Immature Granulocytes: 0.14 10*3/uL — ABNORMAL HIGH (ref 0.00–0.07)
Basophils Absolute: 0 10*3/uL (ref 0.0–0.1)
Basophils Relative: 1 %
Eosinophils Absolute: 0 10*3/uL (ref 0.0–0.5)
Eosinophils Relative: 2 %
HCT: 28.2 % — ABNORMAL LOW (ref 36.0–46.0)
Hemoglobin: 9.4 g/dL — ABNORMAL LOW (ref 12.0–15.0)
Immature Granulocytes: 12 %
Lymphocytes Relative: 16 %
Lymphs Abs: 0.2 10*3/uL — ABNORMAL LOW (ref 0.7–4.0)
MCH: 28.7 pg (ref 26.0–34.0)
MCHC: 33.3 g/dL (ref 30.0–36.0)
MCV: 86.2 fL (ref 80.0–100.0)
Monocytes Absolute: 0 10*3/uL — ABNORMAL LOW (ref 0.1–1.0)
Monocytes Relative: 2 %
Neutro Abs: 0.8 10*3/uL — ABNORMAL LOW (ref 1.7–7.7)
Neutrophils Relative %: 67 %
Platelets: 125 10*3/uL — ABNORMAL LOW (ref 150–400)
RBC: 3.27 MIL/uL — AB (ref 3.87–5.11)
RDW: 12.9 % (ref 11.5–15.5)
WBC: 1.2 10*3/uL — AB (ref 4.0–10.5)
nRBC: 0 % (ref 0.0–0.2)

## 2018-04-20 LAB — PROCALCITONIN: Procalcitonin: 0.58 ng/mL

## 2018-04-20 LAB — BASIC METABOLIC PANEL
Anion gap: 4 — ABNORMAL LOW (ref 5–15)
BUN: 12 mg/dL (ref 6–20)
CO2: 26 mmol/L (ref 22–32)
Calcium: 8.3 mg/dL — ABNORMAL LOW (ref 8.9–10.3)
Chloride: 106 mmol/L (ref 98–111)
Creatinine, Ser: 0.61 mg/dL (ref 0.44–1.00)
GFR calc Af Amer: >60 mL/min (ref >60)
GFR calc non Af Amer: >60 mL/min (ref >60)
Glucose, Bld: 105 mg/dL — ABNORMAL HIGH (ref 70–99)
POTASSIUM: 3.9 mmol/L (ref 3.5–5.1)
SODIUM: 136 mmol/L (ref 135–145)

## 2018-04-20 MED ORDER — DOXYCYCLINE HYCLATE 100 MG PO TABS: 100.0000 mg | ORAL_TABLET | Freq: Two times a day (BID) | ORAL | 0 refills | Status: AC

## 2018-04-20 MED ORDER — VITAMIN D (ERGOCALCIFEROL) 1.25 MG (50000 UNIT) PO CAPS: 50000.0000 [IU] | ORAL_CAPSULE | ORAL | 0 refills | Status: AC

## 2018-04-20 MED ORDER — ACETAMINOPHEN 325 MG PO TABS: 650.0000 mg | ORAL_TABLET | Freq: Four times a day (QID) | ORAL | Status: AC | PRN

## 2018-04-20 MED ORDER — HEPARIN SOD (PORK) LOCK FLUSH 100 UNIT/ML IV SOLN
500.0000 [IU] | Freq: Once | INTRAVENOUS | Status: AC
  Administered 2018-04-20: 500 [IU] via INTRAVENOUS
  Filled 2018-04-20: qty 5

## 2018-04-20 MED ORDER — DOXYCYCLINE HYCLATE 100 MG PO TABS: 100.0000 mg | ORAL_TABLET | Freq: Two times a day (BID) | ORAL | Status: DC

## 2018-04-20 MED ORDER — POTASSIUM CHLORIDE CRYS ER 20 MEQ PO TBCR: 40.0000 meq | EXTENDED_RELEASE_TABLET | Freq: Two times a day (BID) | ORAL | 0 refills | Status: AC

## 2018-04-20 MED ORDER — TBO-FILGRASTIM 480 MCG/0.8ML ~~LOC~~ SOSY: 480.0000 ug | PREFILLED_SYRINGE | Freq: Every day | SUBCUTANEOUS | 0 refills | Status: AC

## 2018-04-20 NOTE — Progress Notes (Signed)
Patient discharging on neulasta medication which is difficult to obtain and financially unaffordable. Previous RNCM was able to order medication through [facility] where patient is seen. Script was faxed to pharmacy and it is scheduled to be delivered tomorrow for patient to take 4th dosage. Patient received dose this am. Medication is ordered a total of 10 doses. RNCM and patient have been in touch with clinic and per them everything is ready for discharge. Provided patient with hard script as well as info for the clinic in case there are unforseen issues.

## 2018-04-20 NOTE — Consult Note (Signed)
PHARMACY CONSULT NOTE - FOLLOW UP  Pharmacy Consult for Electrolyte Monitoring and Replacement   Recent Labs: Potassium (mmol/L)  Date Value  04/20/2018 3.9   Magnesium (mg/dL)  Date Value  04/20/2018 2.1   Calcium (mg/dL)  Date Value  04/20/2018 8.3 (L)   Sodium (mmol/L)  Date Value  04/20/2018 136     Assessment: Patient with low K level.  Patient on KCl 41mEq PO tid now.  Goal of Therapy:  K ~ 4.0 Mg ~ 2.0  Plan:  02/11 K 3.9 will continue with KCl 27mEq PO tid, this seems to be holding her K level in range. Recheck BMET with AM labs.  Paulina Fusi, PharmD, BCPS 04/20/2018 10:29 AM

## 2018-04-20 NOTE — Discharge Summary (Signed)
[facility] NAME: Brianna Chung    MR#:  [id number]  DATE OF BIRTH:  [birth year]  DATE OF ADMISSION:  04/16/2018 ADMITTING PHYSICIAN: Gladstone Lighter, MD  DATE OF DISCHARGE:  04/20/18  PRIMARY CARE PHYSICIAN: [facility]    ADMISSION DIAGNOSIS:  Fever, unspecified fever cause [R50.9] Pneumonia due to infectious organism, unspecified laterality, unspecified part of lung [J18.9]  DISCHARGE DIAGNOSIS:  Active Problems:   Pneumonia  Chemo induced neutropenia and thrombocytopenia SECONDARY DIAGNOSIS:   Past Medical History:  Diagnosis Date  . Hypertension   . Small cell B-cell lymphoma [facility])     HOSPITAL COURSE:   Brianna Chung  is a 58 y.o. 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 Patient was having fevers and cough about 3 months ago and was diagnosed to have a left upper lobe lung mass and mediastinal mass which were diagnosed after biopsy to be large B-cell lymphoma.  She was admitted to the hospital 2 weeks ago for left upper extremity swelling and was noted to have DVT and was discharged on Eliquis.  She has seen her oncologist last week and has received her first dose of chemootherapy with R-EPOCH.  She is supposed to follow-up to get the Neulasta injection in 2 days.  This morning when she woke up, she felt unusually tired, was having chills and low-grade fever at home and so presented to the emergency room.  Denies any sick contacts, no recent travel.  No abdominal pain, nausea vomiting or urinary complaints.  Labs here show a white count of 7.5 with neutrophilia, hypokalemia and chest x-ray with mild left lower lobe infiltrate. She is also noted to be febrile and tachycardic.    1. sepsis-neutropenia due to recent chemotherapy, has predominant  neutrophilia -Has low-grade fever, tachypnea and tachycardia at the time of admission Fever free for the past 24 hrs  -Chest x-ray with left lower lobe infiltrate with a left-sided pleural effusion but patient is asymptomatic - blood cultures negative so far MRSA PCR negative, negative urine analysis. Started on vancomycin and cefepime due to her recent hospitalization and and immunosuppressive therapy.  DC'd with vancomycin and continued cefepime -Influenza panel negative and respiratory virus panel are negative -ID consult placed for persistent fever.  The fever can be from the tumor but ID opinion and probably not from pneumonia, recommending 2 more days of doxycycline and discontinue antibiotics.  Appreciate ID recommendations  2. Hypokalemia-being replaced.   Magnesium at 2.1 potassium at 3.9 . Continue pot supplements  3. Hypertension-on losartan and hydrochlorothiazide.  4. Left upper extremity DVT-continue Eliquis.   5.  Large left upper lobe mass with extensive involvement of the mediastinum- Large B-cell lymphoma-just started her first dose of chemotherapy with R-EPOCH. -Received last week. On acyclovir for prophylaxis. Which we will continue Outpatient follow-up with [facility] as recommended Discussed with [facility] NP [name] at [phone].  Okay to discharge patient from oncology standpoint aware of the blood counts  Recommending Neupogen 5 mcg/kg once daily subcutaneously for a total of 10 days Started first dose 04/18/18  discussed with pharmacist.   Patient is to follow-up with [facility] oncology on Friday, 04/22/2018 Case management/social worker is arranging financial assistance for Granix/Neupogen to be continue at home   DISCHARGE CONDITIONS:   Stable   CONSULTS OBTAINED:  Treatment  Team:  Tsosie Billing, MD   PROCEDURES none  DRUG ALLERGIES:  No Known Allergies  DISCHARGE MEDICATIONS:   Allergies as of 04/20/2018   No Known Allergies      Medication List    TAKE these medications   acetaminophen 325 MG tablet Commonly known as:  TYLENOL Take 2 tablets (650 mg total) by mouth every 6 (six) hours as needed for mild pain (or Fever >/= 101).   acyclovir 200 MG capsule Commonly known as:  ZOVIRAX Take 200 mg by mouth 2 (two) times daily.   apixaban 5 MG Tabs tablet Commonly known as:  ELIQUIS Take 1 tablet (5 mg total) by mouth 2 (two) times daily.   doxycycline 100 MG tablet Commonly known as:  VIBRA-TABS Take 1 tablet (100 mg total) by mouth every 12 (twelve) hours for 4 days.   FIRST-MOUTHWASH BLM Susp Swish and spit 5 mLs every 6 (six) hours as needed (Oral ulcers and pain)   hydrochlorothiazide 12.5 MG tablet Commonly known as:  HYDRODIURIL Take 12.5 mg by mouth daily.   loratadine 10 MG tablet Commonly known as:  CLARITIN Take 10 mg by mouth daily as needed for allergies.   losartan 100 MG tablet Commonly known as:  COZAAR Take 100 mg by mouth daily.   ondansetron 4 MG tablet Commonly known as:  ZOFRAN Take 4 mg by mouth every 8 (eight) hours as needed.   polyethylene glycol packet Commonly known as:  MIRALAX / GLYCOLAX Take 17 g by mouth daily.   potassium chloride SA 20 MEQ tablet Commonly known as:  K-DUR,KLOR-CON Take 2 tablets (40 mEq total) by mouth 2 (two) times daily. What changed:  how much to take   senna-docusate 8.6-50 MG tablet Commonly known as:  Senokot-S Take 2 tablets by mouth 2 (two) times daily.   Tbo-Filgrastim 480 MCG/0.8ML Sosy injection Commonly known as:  GRANIX Inject 0.8 mLs (480 mcg total) into the skin daily for 7 days. Start taking on:  April 21, 2018   Vitamin D (Ergocalciferol) 1.25 MG (50000 UT) Caps capsule Commonly known as:  DRISDOL Take 1 capsule (50,000 Units total) by mouth every Sunday for 30 days.        DISCHARGE INSTRUCTIONS:   Follow-up with primary care physician in 2 to 3 days Follow-up with [facility]ology on Friday,  04/22/2018  DIET:  Cardiac diet  DISCHARGE CONDITION:  Stable  ACTIVITY:  Activity as tolerated  OXYGEN:  Home Oxygen: No.   Oxygen Delivery: room air  DISCHARGE LOCATION:  home   If you experience worsening of your admission symptoms, develop shortness of breath, life threatening emergency, suicidal or homicidal thoughts you must seek medical attention immediately by calling 911 or calling your MD immediately  if symptoms less severe.  You Must read complete instructions/literature along with all the possible adverse reactions/side effects for all the Medicines you take and that have been prescribed to you. Take any new Medicines after you have completely understood and accpet all the possible adverse reactions/side effects.   Please note  You were cared for by a hospitalist during your hospital stay. If you have any questions about your discharge medications or the care you received while you were in the hospital after you are discharged, you can call the unit and asked to speak with the hospitalist on call if the hospitalist that took care of you is not available. Once you are discharged, your primary care physician will handle any further medical issues. Please note  that NO REFILLS for any discharge medications will be authorized once you are discharged, as it is imperative that you return to your primary care physician (or establish a relationship with a primary care physician if you do not have one) for your aftercare needs so that they can reassess your need for medications and monitor your lab values.     Today  Chief Complaint  Patient presents with  . Fever   Patient is doing fine denies any chest pain or shortness of breath.  No fever in the past 24 hours and patient wants to go home.Faythe Ghee to discharge patient from primary oncology standpoint  ROS:  CONSTITUTIONAL: Denies fevers, chills. Denies any fatigue, weakness.  EYES: Denies blurry vision, double vision, eye  pain. EARS, NOSE, THROAT: Denies tinnitus, ear pain, hearing loss. RESPIRATORY: Denies cough, wheeze, shortness of breath.  CARDIOVASCULAR: Denies chest pain, palpitations, edema.  GASTROINTESTINAL: Denies nausea, vomiting, diarrhea, abdominal pain. Denies bright red blood per rectum. GENITOURINARY: Denies dysuria, hematuria. ENDOCRINE: Denies nocturia or thyroid problems. HEMATOLOGIC AND LYMPHATIC: Denies easy bruising or bleeding. SKIN: Denies rash or lesion. MUSCULOSKELETAL: Denies pain in neck, back, shoulder, knees, hips or arthritic symptoms.  NEUROLOGIC: Denies paralysis, paresthesias.  PSYCHIATRIC: Denies anxiety or depressive symptoms.   VITAL SIGNS:  Blood pressure 114/72, pulse 91, temperature 98.8 F (37.1 C), temperature source Oral, resp. rate 18, height 5\' 5"  (1.651 m), weight 90.6 kg, last menstrual period 03/16/2018, SpO2 98 %.  I/O:    Intake/Output Summary (Last 24 hours) at 04/20/2018 1313 Last data filed at 04/20/2018 1100 Gross per 24 hour  Intake 1009.04 ml  Output -  Net 1009.04 ml    PHYSICAL EXAMINATION:  GENERAL:  58 y.o.-year-old patient lying in the bed with no acute distress.  EYES: Pupils equal, round, reactive to light and accommodation. No scleral icterus. Extraocular muscles intact.  HEENT: Head atraumatic, normocephalic. Oropharynx and nasopharynx clear.  NECK:  Supple, no jugular venous distention. No thyroid enlargement, no tenderness.  LUNGS: Normal breath sounds bilaterally, no wheezing, rales,rhonchi or crepitation. No use of accessory muscles of respiration.  Right anterior chest wall with Port-A-Cath CARDIOVASCULAR: S1, S2 normal. No murmurs, rubs, or gallops.  ABDOMEN: Soft, non-tender, non-distended. Bowel sounds present.  EXTREMITIES: No pedal edema, cyanosis, or clubbing.  NEUROLOGIC: Cranial nerves II through XII are intact. Muscle strength 5/5 in all extremities. Sensation intact. Gait not checked.  PSYCHIATRIC: The patient is alert  and oriented x 3.  SKIN: No obvious rash, lesion, or ulcer.   DATA REVIEW:   CBC Recent Labs  Lab 04/20/18 0518  WBC 1.2*  HGB 9.4*  HCT 28.2*  PLT 125*    Chemistries  Recent Labs  Lab 04/20/18 0518  NA 136  K 3.9  CL 106  CO2 26  GLUCOSE 105*  BUN 12  CREATININE 0.61  CALCIUM 8.3*  MG 2.1    Cardiac Enzymes No results for input(s): TROPONINI in the last 168 hours.  Microbiology Results  Results for orders placed or performed during the hospital encounter of 04/16/18  Blood culture (routine x 2)     Status: None (Preliminary result)   Collection Time: 04/16/18  4:17 PM  Result Value Ref Range Status   Specimen Description BLOOD RIGHT ANTECUBITAL  Final   Special Requests   Final    BOTTLES DRAWN AEROBIC AND ANAEROBIC Blood Culture results may not be optimal due to an excessive volume of blood received in culture bottles   Culture  Final    NO GROWTH 4 DAYS Performed at [facility], [address] [place], [place] [address]    Report Status PENDING  Incomplete  Blood culture (routine x 2)     Status: None (Preliminary result)   Collection Time: 04/16/18  9:56 PM  Result Value Ref Range Status   Specimen Description BLOOD RIGHT HAND  Final   Special Requests   Final    IN PEDIATRIC BOTTLE Blood Culture results may not be optimal due to an inadequate volume of blood received in culture bottles   Culture   Final    NO GROWTH 4 DAYS Performed at [facility], [address] [place], [place] [address]    Report Status PENDING  Incomplete  Respiratory Panel by PCR     Status: None   Collection Time: 04/16/18 10:35 PM  Result Value Ref Range Status   Adenovirus NOT DETECTED NOT DETECTED Final   Coronavirus 229E NOT DETECTED NOT DETECTED Final    Comment: (NOTE) The Coronavirus on the Respiratory Panel, DOES NOT test for the novel  Coronavirus (2019 nCoV)    Coronavirus HKU1 NOT DETECTED NOT DETECTED Final   Coronavirus NL63 NOT  DETECTED NOT DETECTED Final   Coronavirus OC43 NOT DETECTED NOT DETECTED Final   Metapneumovirus NOT DETECTED NOT DETECTED Final   Rhinovirus / Enterovirus NOT DETECTED NOT DETECTED Final   Influenza A NOT DETECTED NOT DETECTED Final   Influenza B NOT DETECTED NOT DETECTED Final   Parainfluenza Virus 1 NOT DETECTED NOT DETECTED Final   Parainfluenza Virus 2 NOT DETECTED NOT DETECTED Final   Parainfluenza Virus 3 NOT DETECTED NOT DETECTED Final   Parainfluenza Virus 4 NOT DETECTED NOT DETECTED Final   Respiratory Syncytial Virus NOT DETECTED NOT DETECTED Final   Bordetella pertussis NOT DETECTED NOT DETECTED Final   Chlamydophila pneumoniae NOT DETECTED NOT DETECTED Final   Mycoplasma pneumoniae NOT DETECTED NOT DETECTED Final  MRSA PCR Screening     Status: None   Collection Time: 04/16/18 10:35 PM  Result Value Ref Range Status   MRSA by PCR NEGATIVE NEGATIVE Final    Comment:        The GeneXpert MRSA Assay (FDA approved for NASAL specimens only), is one component of a comprehensive MRSA colonization surveillance program. It is not intended to diagnose MRSA infection nor to guide or monitor treatment for MRSA infections. Performed at [facility], [address] [place], [place] [address]   CULTURE, BLOOD (ROUTINE X 2) w Reflex to ID Panel     Status: None (Preliminary result)   Collection Time: 04/19/18  9:44 AM  Result Value Ref Range Status   Specimen Description BLOOD RIGHT ARM  Final   Special Requests   Final    BOTTLES DRAWN AEROBIC AND ANAEROBIC Blood Culture adequate volume   Culture   Final    NO GROWTH < 24 HOURS Performed at [facility], [address], [place], [place] [address]    Report Status PENDING  Incomplete  CULTURE, BLOOD (ROUTINE X 2) w Reflex to ID Panel     Status: None (Preliminary result)   Collection Time: 04/19/18  9:45 AM  Result Value Ref Range Status   Specimen Description BLOOD BLOOD RIGHT HAND  Final   Special  Requests   Final    BOTTLES DRAWN AEROBIC AND ANAEROBIC Blood Culture adequate volume   Culture   Final    NO GROWTH < 24 HOURS Performed at [facility], [address] [place], [place] [address]    Report Status PENDING  Incomplete    RADIOLOGY:  Dg Chest 2 View  Result Date: 04/19/2018 CLINICAL DATA:  Increasing shortness of breath, fever, history lymphoma, hypertension EXAM: CHEST - 2 VIEW COMPARISON:  04/16/2018 Correlation: CT chest 04/01/2018 FINDINGS: RIGHT jugular Port-A-Cath with tip projecting over SVC. Enlargement of cardiac silhouette. Large LEFT upper lobe/AP window mass and suspected hilar adenopathy again identified, mediastinal involvement less evident by chest radiograph than on prior CT. Consolidation of LEFT lower lobe with associated small LEFT pleural effusion. RIGHT lung clear. No pneumothorax or acute osseous findings. IMPRESSION: Large LEFT upper lobe/AP window mass, with extensive mediastinal involvement by CT. LEFT lower lobe consolidation consistent with pneumonia. Associated LEFT pleural effusion. Electronically Signed   By: Lavonia Dana M.D.   On: 04/19/2018 09:24   Dg Chest 2 View  Result Date: 04/16/2018 CLINICAL DATA:  Fever, history of lymphoma. EXAM: CHEST - 2 VIEW COMPARISON:  CT scan of April 01, 2018. FINDINGS: Mild cardiomegaly is noted. Right internal jugular Port-A-Cath is unchanged in position. Large left upper lobe mass is again noted consistent with history of lymphoma. Right lung is clear. Mild left pleural effusion is noted with probable underlying atelectasis or infiltrate. Bony thorax is unremarkable. IMPRESSION: Mild left pleural effusion is noted with probable underlying atelectasis or infiltrate. Large left upper lobe mass is again noted consistent with history of lymphoma. Electronically Signed   By: Marijo Conception, M.D.   On: 04/16/2018 18:40    EKG:  No orders found for this or any previous visit.    Management plans discussed  with the patient, family and they are in agreement.  CODE STATUS:     Code Status Orders  (From admission, onward)         Start     Ordered   04/16/18 2149  Full code  Continuous     04/16/18 2148        Code Status History    Date Active Date Inactive Code Status Order ID Comments User Context   04/01/2018 2236 04/02/2018 1937 Full Code [id number]  Lance Coon, MD Inpatient      TOTAL TIME TAKING CARE OF THIS PATIENT:43  minutes.   Note: This dictation was prepared with Dragon dictation along with smaller phrase technology. Any transcriptional errors that result from this process are unintentional.   @MEC @  on 04/20/2018 at 1:13 PM  Between 7am to 6pm - Pager - [phone]  After 6pm go to [email or web address] - password [email or web address] [facility]  [facility]  Office  [phone]  CC: Primary care physician; [facility]

## 2018-04-20 NOTE — Progress Notes (Signed)
Reviewed AVS with pt, pr verbalized understanding. 3 printed RX with AVS

## 2018-04-20 NOTE — Discharge Instructions (Signed)
Follow-up with primary care physician in 2 to 3 days Follow-up with [facility] oncology on Friday, 04/22/2018

## 2018-04-21 LAB — CULTURE, BLOOD (ROUTINE X 2)
Culture: NO GROWTH
Culture: NO GROWTH

## 2018-04-24 LAB — CULTURE, BLOOD (ROUTINE X 2)
Culture: NO GROWTH
Culture: NO GROWTH
SPECIAL REQUESTS: ADEQUATE
Special Requests: ADEQUATE

## 2020-04-20 IMAGING — CT CT ANGIO CHEST
2 of 6 series · 16 of 46 positions shown · IV contrast (APPLIED)
Comparison: No prior chest imaging.

CLINICAL DATA: PE suspected, high pretest prob SOB, large LUE clot.
Left arm swelling and shortness of breath.

EXAM:
CT ANGIOGRAPHY CHEST WITH CONTRAST
TECHNIQUE: Multidetector CT imaging of the chest was performed using the
standard protocol during bolus administration of intravenous
contrast. Multiplanar CT image reconstructions and MIPs were
obtained to evaluate the vascular anatomy.
CONTRAST:  75mL OMNIPAQUE IOHEXOL 350 MG/ML SOLN

[Series 6: thins · axial · 0.66mm/px · z∈[-542,-312]mm · 13 of 252 slices shown]
[im 11/252  lung]
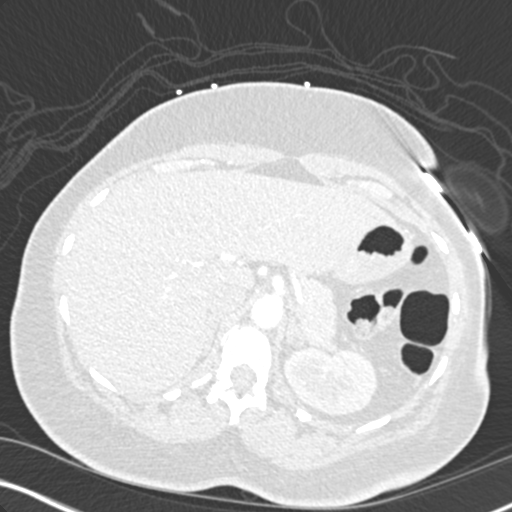
[im 33/252  soft-tissue]
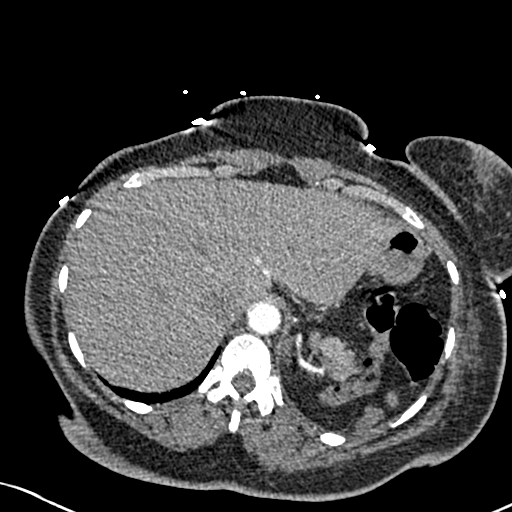
[im 55/252  lung]
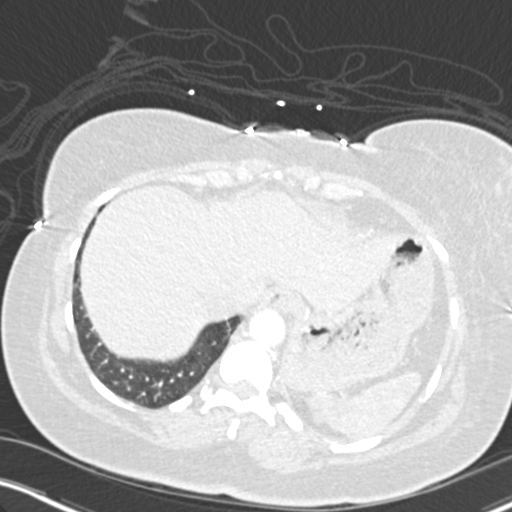
[im 66/252  soft-tissue]
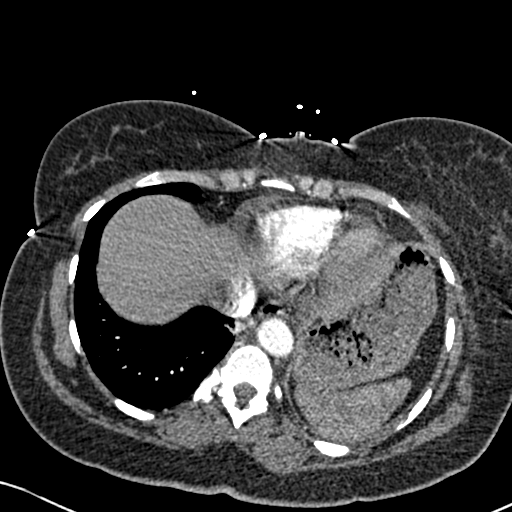
[im 88/252  lung]
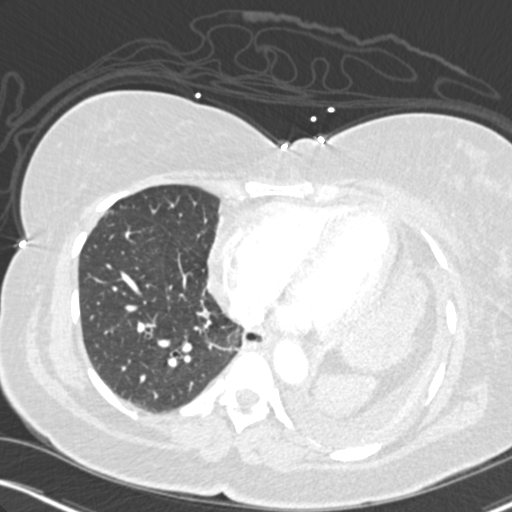
[im 110/252  soft-tissue]
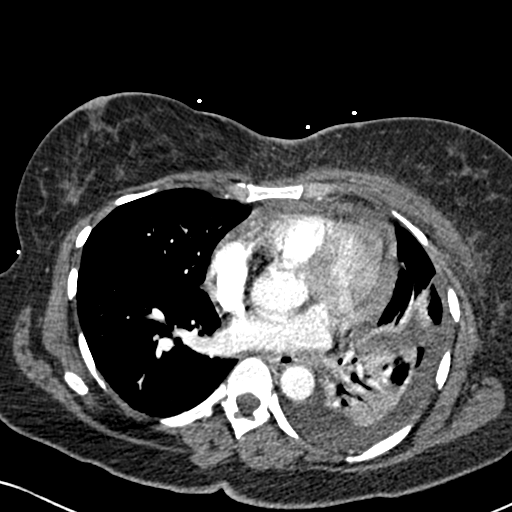
[im 131/252  lung]
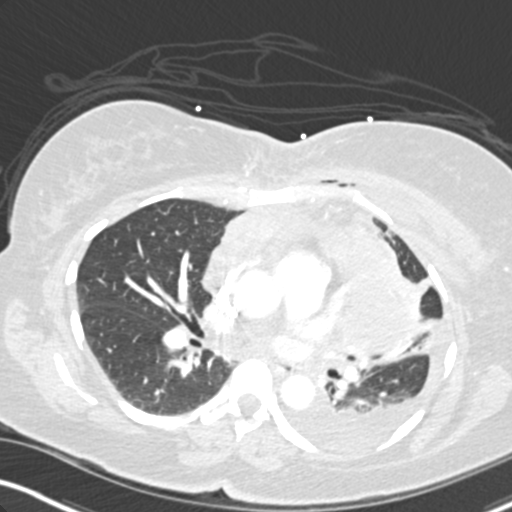
[im 142/252  soft-tissue]
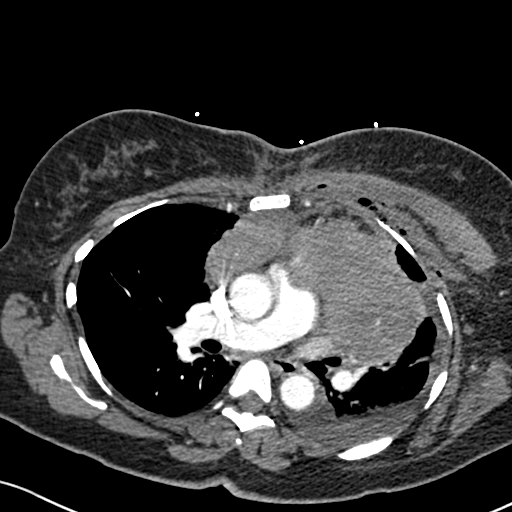
[im 164/252  lung]
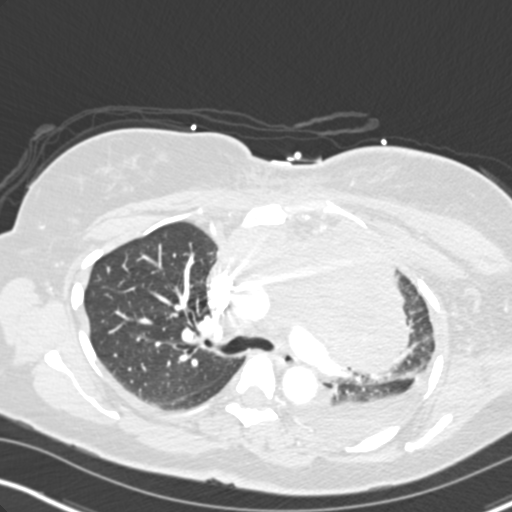
[im 186/252  soft-tissue]
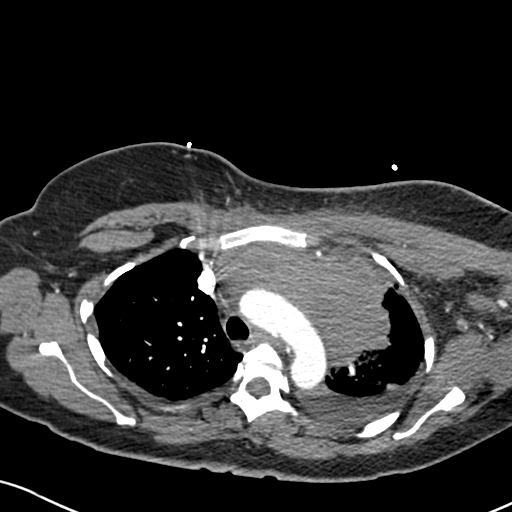
[im 197/252  lung]
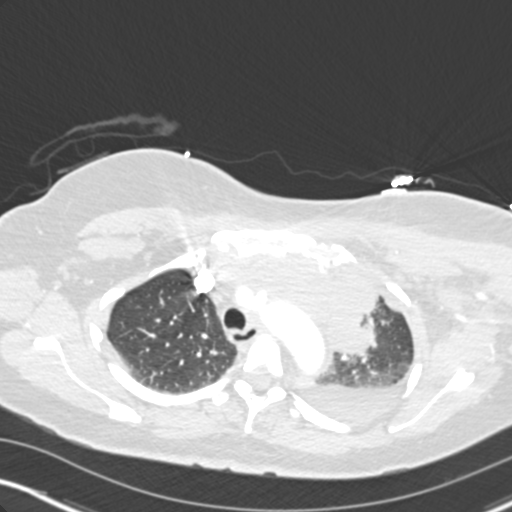
[im 219/252  soft-tissue]
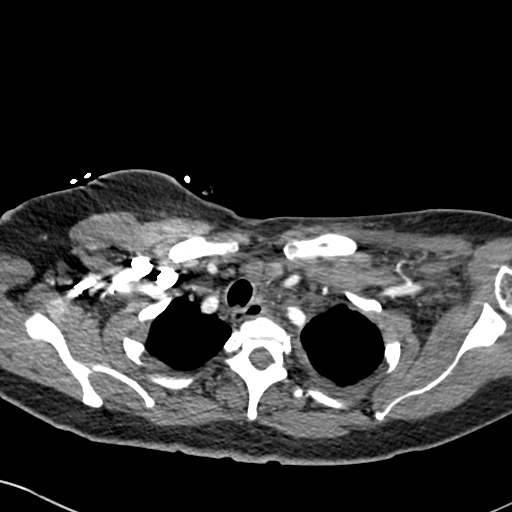
[im 241/252  lung]
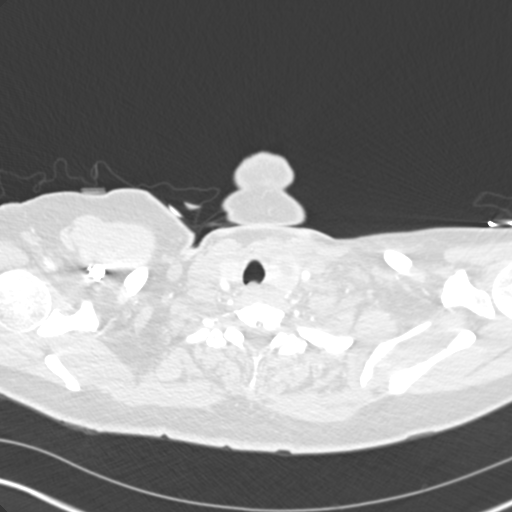

[Series 8: coronal mpr · coronal · 0.49mm/px · 3 of 78 slices shown]
[im 20/78  soft-tissue]
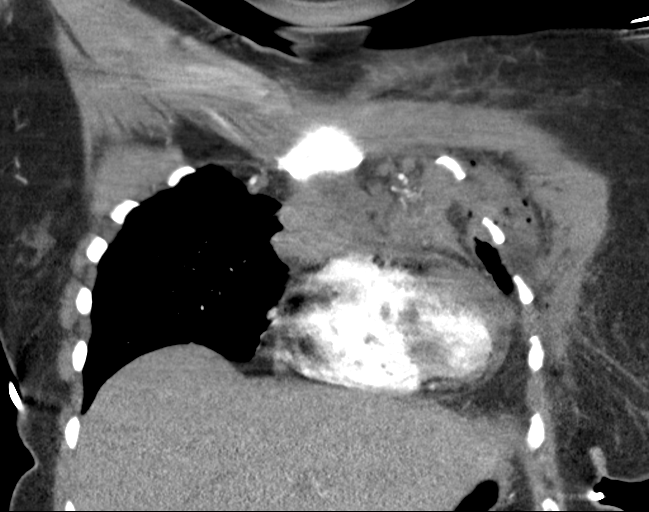
[im 39/78  soft-tissue]
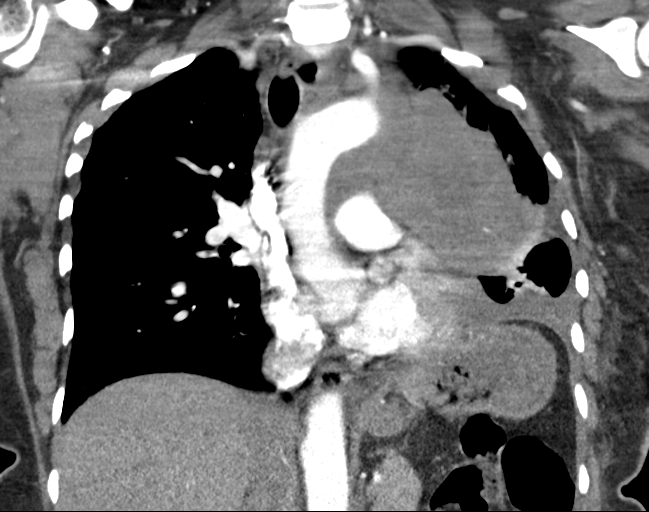
[im 58/78  soft-tissue]
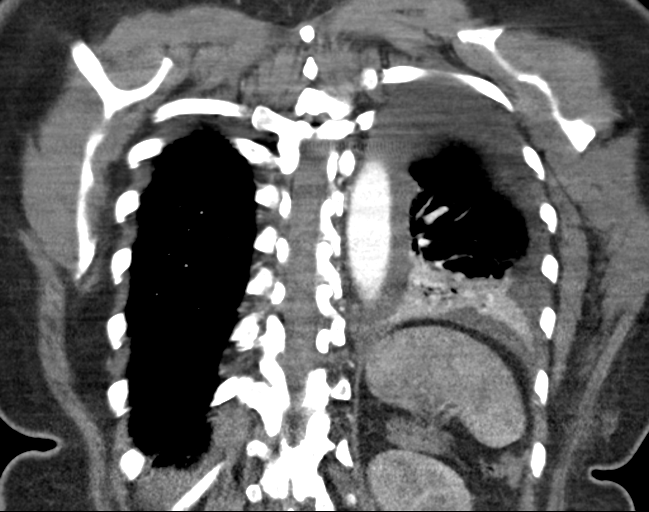

[16 of 46 positions shown; findings below may reference images not displayed]

FINDINGS: Cardiovascular: No filling defects in the pulmonary arteries to
suggest pulmonary embolus. The left upper lobe pulmonary arteries
attenuated due to large anterior mediastinal and hilar masslike
opacity which partially encases segmental branches to the lingula
and upper lobe. Anterior mediastinal mass abuts the aortic arch and
SVC. No evidence of SVC occlusion, with dense contrast visualized
throughout the entirety of the SVC. The left subclavian vein is not
well evaluated on the current exam, but was occluded on ultrasound.
No aortic dissection or aneurysm. The heart is normal in size. There
is pericardial thickening/effusion inferiorly, which is in part
contiguous with mediastinal mass.

Mediastinum/Nodes: Large lobulated anterior mediastinal masslike
opacity is contiguous with the left hilum. Dimensions are difficult
to accurately define due to the shape size of the lesion, however
measures at least 13 x 7.2 x 6.6 cm. Internal density is slightly
heterogeneous. No internal calcifications. Mass lesion causes mass
effect on left upper lobe bronchus, which is difficult to define but
likely occluded, for example image 35 series 7. Mild left
supraclavicular adenopathy. Prominent subcarinal/inferior right
hilar lymph node measuring 13 mm short axis. Irregularly-shaped
hypodense left thyroid nodule measures at least 2.7 cm cranial
caudal dimension. There is mild mass effect on the trachea to the
right. Small left axillary nodes.

Lungs/Pleura: Masslike opacity in the anterior mediastinum is
contiguous with the left hilum, unclear if this is central lung
lesion with associated conglomerate adenopathy or simply massive
adenopathy. Borders in the left upper lobe are nodular and irregular
with small left upper lobe nodules, for example image 51 series 7.
Moderate size left pleural effusion which is partially loculated,
causes partial atelectasis of the left lower lobe. No other than
mild atelectasis in the right lower lobe, the right lung is clear.
No confluent airspace disease or pulmonary nodule. No evidence of
pneumothorax.

Upper Abdomen: No nodule the visualized adrenal glands. No definite
focal hepatic lesion, use of contrast slightly limits assessment.

Musculoskeletal: Air in the left anterior chest wall with associated
thickening of the left pectoralis muscles, patient with recent chest
mass biopsy. Subareolar breast tissue is fairly symmetric. No frank
bony destructive change or evidence of focal lesion, with particular
attention to left anterior ribs.

Review of the MIP images confirms the above findings.
IMPRESSION: 1. No pulmonary embolus. Left upper lobe pulmonary arteries are
attenuated due to mediastinal mass.
2. Large chest mass involving the anterior mediastinum, left hilum
and possibly central left lung. This has been reportedly biopsied an
outside institution. Small amount of air and stranding in the left
anterior chest wall is likely secondary to recent biopsy, recommend
correlation with procedural history.
3. Moderate partially loculated left pleural effusion. Associated
compressive atelectasis in the left lower lobe.
4. Left thyroid nodule.

## 2020-08-26 IMAGING — US US EXTREM  UP VENOUS*L*
1 series · 13 of 24 positions shown · non-contrast
Comparison: None.

CLINICAL DATA: Left arm swelling for 1 day



[Series 1: us extrem up venous*left* · 0.08mm/px · 13 of 33 slices shown]
[im 1/33]
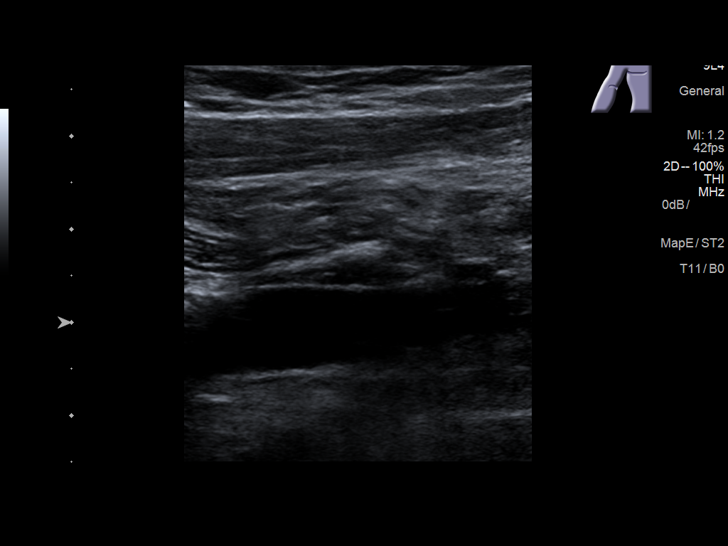
[im 3/33]
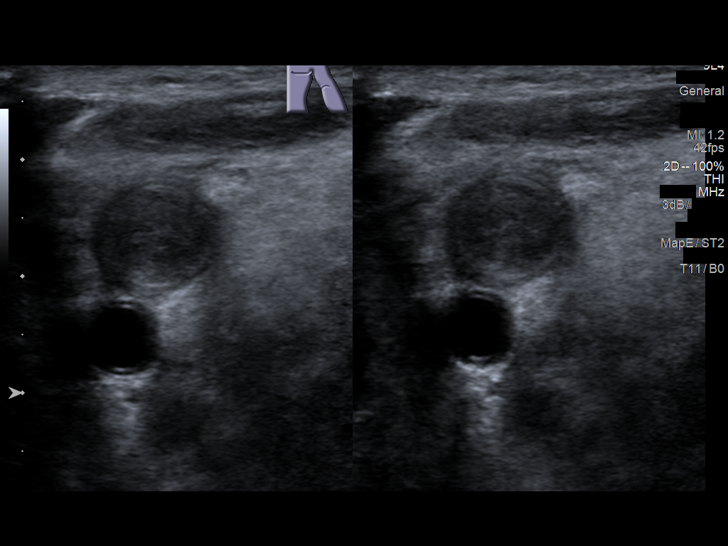
[im 6/33]
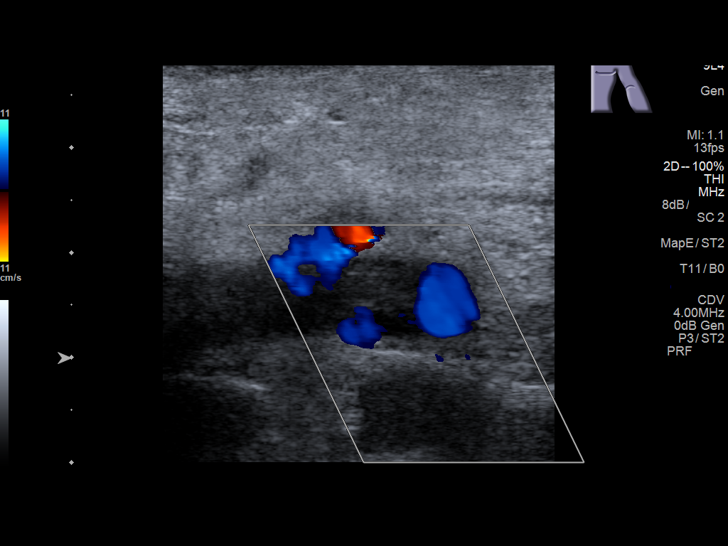
[im 9/33]
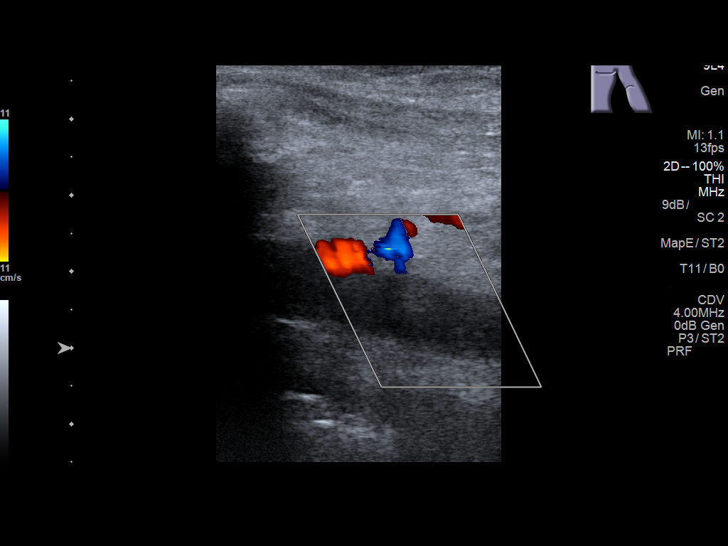
[im 12/33]
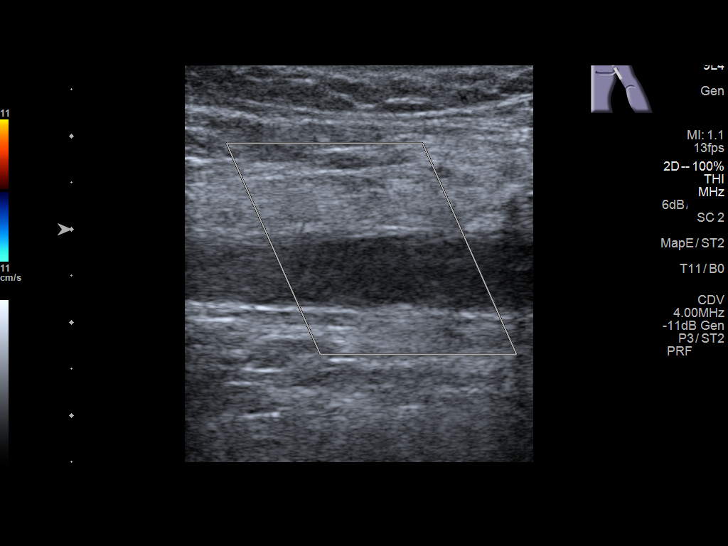
[im 14/33]
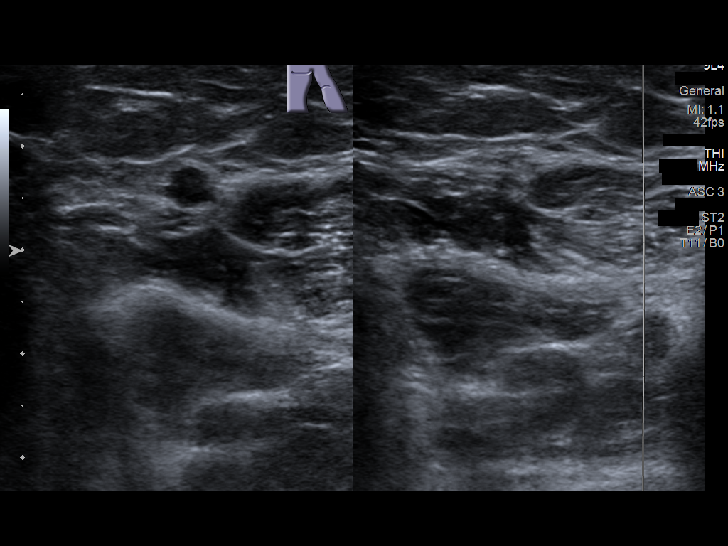
[im 17/33]
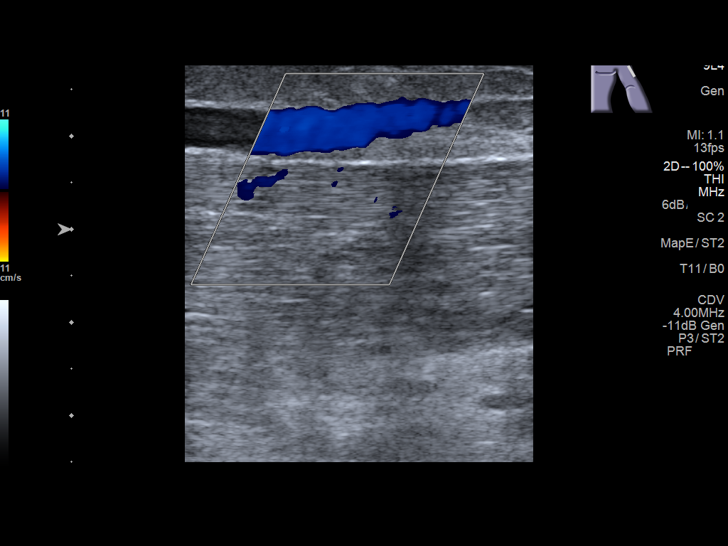
[im 19/33]
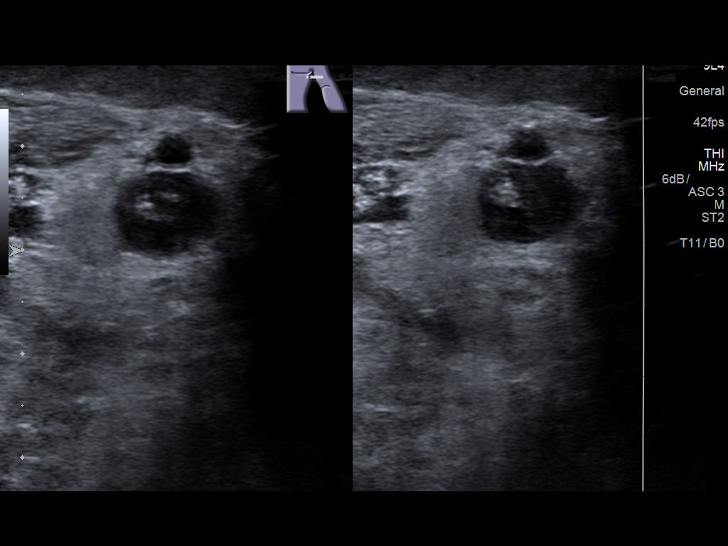
[im 21/33]
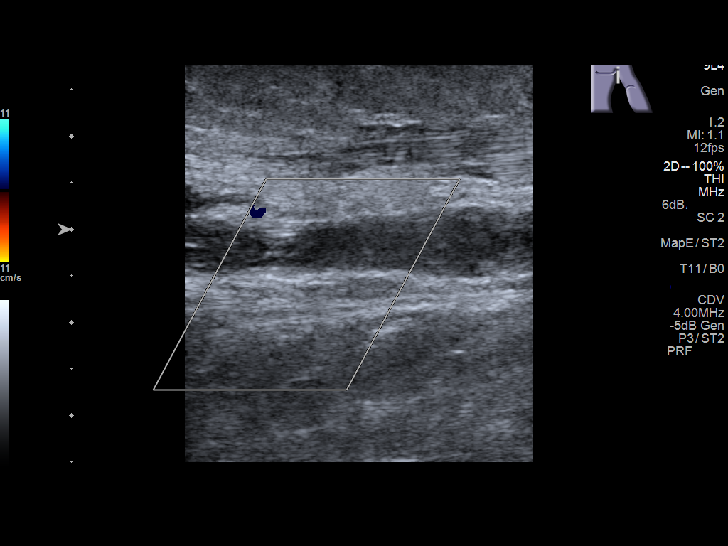
[im 24/33]
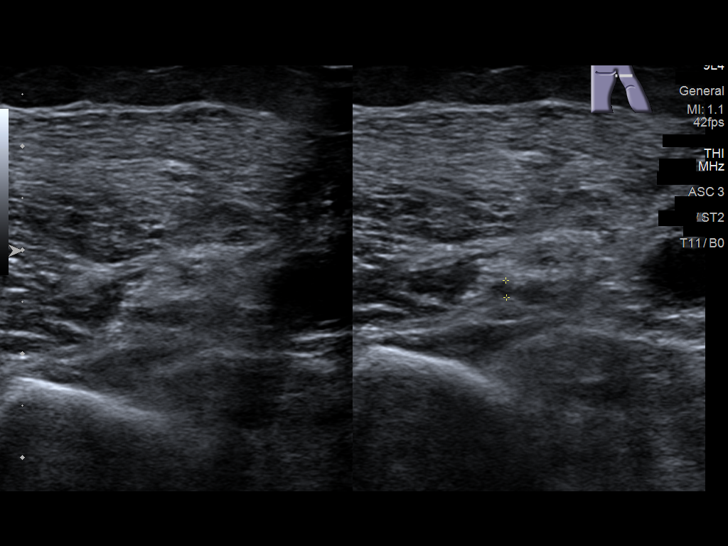
[im 27/33]
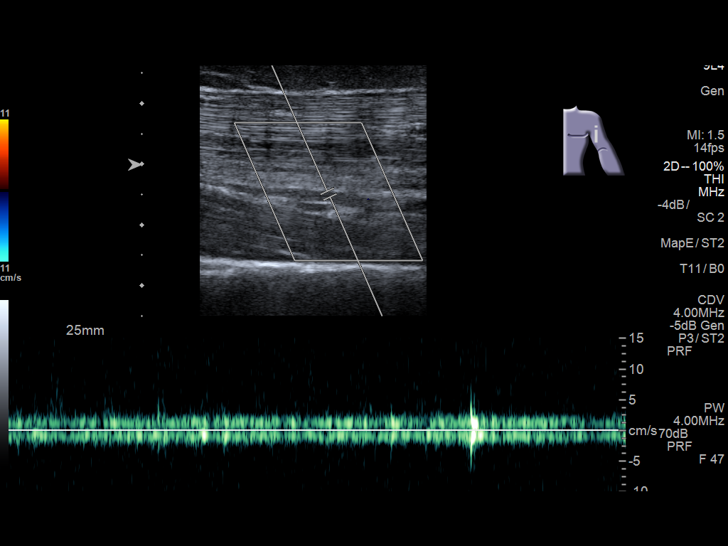
[im 30/33]
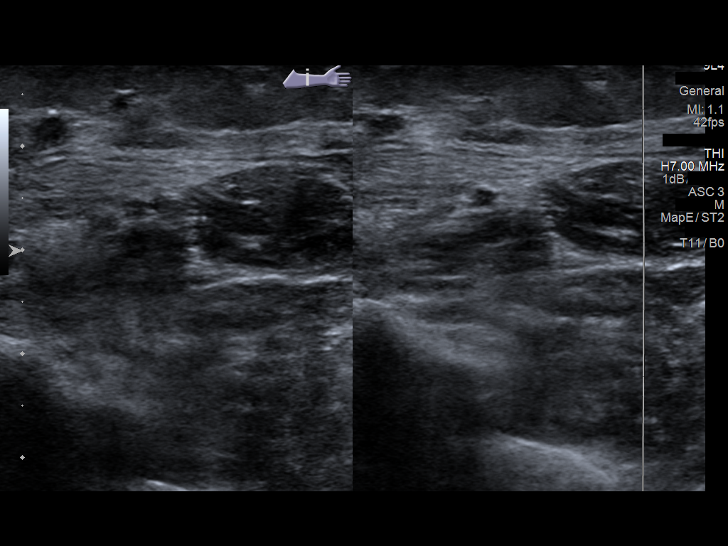
[im 33/33]
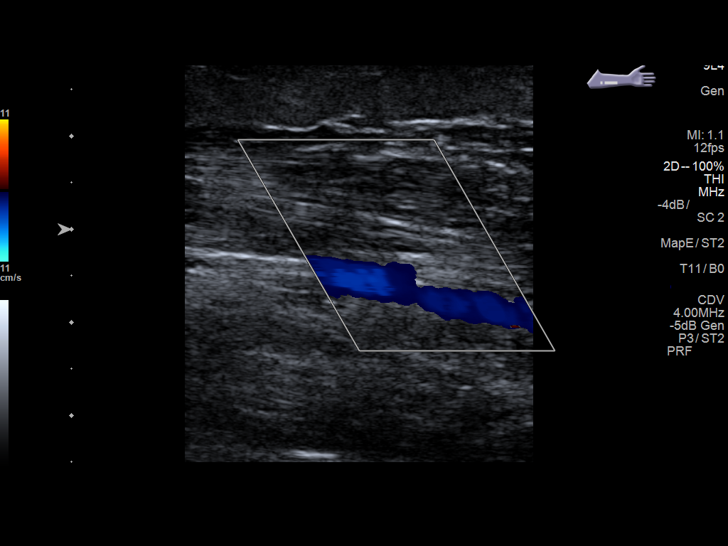

[13 of 24 positions shown; findings below may reference images not displayed]

FINDINGS: Contralateral Subclavian Vein: Respiratory phasicity is normal and
symmetric with the symptomatic side. No evidence of thrombus. Normal
compressibility.

Internal Jugular Vein: Occlusive thrombus is noted.

Subclavian Vein: Extensive thrombus is noted.  Minimal flow is seen.

Axillary Vein: Occlusive thrombus is noted.

Cephalic Vein: No evidence of thrombus. Normal compressibility,
respiratory phasicity and response to augmentation.

Basilic Vein: Occlusive thrombus is noted.

Brachial Veins: 1 of the paired brachial veins demonstrates
occlusive thrombus.

Radial Veins: No evidence of thrombus. Normal compressibility,
respiratory phasicity and response to augmentation.

Ulnar Veins: No evidence of thrombus. Normal compressibility,
respiratory phasicity and response to augmentation.

Venous Reflux:  None visualized.

Other Findings:  None visualized.
IMPRESSION: Extensive left upper extremity thrombus involving the jugular,
subclavian, axillary, basilic and brachial veins.
